# Patient Record
Sex: Male | Born: 1960 | Race: White | Hispanic: No | Marital: Married | Smoking: Former smoker
Health system: Southern US, Community
[De-identification: ages and names within clinical notes are randomized; demographics above are authoritative.]

## PROBLEM LIST (undated history)

## (undated) DIAGNOSIS — N529 Male erectile dysfunction, unspecified: Secondary | ICD-10-CM

## (undated) DIAGNOSIS — I440 Atrioventricular block, first degree: Secondary | ICD-10-CM

## (undated) DIAGNOSIS — E785 Hyperlipidemia, unspecified: Secondary | ICD-10-CM

## (undated) DIAGNOSIS — E8801 Alpha-1-antitrypsin deficiency: Secondary | ICD-10-CM

## (undated) DIAGNOSIS — R911 Solitary pulmonary nodule: Secondary | ICD-10-CM

## (undated) DIAGNOSIS — I10 Essential (primary) hypertension: Secondary | ICD-10-CM

## (undated) DIAGNOSIS — Z8349 Family history of other endocrine, nutritional and metabolic diseases: Secondary | ICD-10-CM

## (undated) HISTORY — DX: Male erectile dysfunction, unspecified: N52.9

## (undated) HISTORY — DX: Essential (primary) hypertension: I10

## (undated) HISTORY — DX: Hyperlipidemia, unspecified: E78.5

## (undated) HISTORY — DX: Alpha-1-antitrypsin deficiency: E88.01

## (undated) HISTORY — DX: Solitary pulmonary nodule: R91.1

## (undated) HISTORY — PX: OTHER SURGICAL HISTORY: SHX169

## (undated) HISTORY — PX: APPENDECTOMY: SHX54

## (undated) HISTORY — DX: Atrioventricular block, first degree: I44.0

## (undated) HISTORY — DX: Family history of other endocrine, nutritional and metabolic diseases: Z83.49

---

## 2012-11-21 ENCOUNTER — Encounter: Payer: Self-pay | Admitting: Emergency Medicine

## 2012-11-21 ENCOUNTER — Emergency Department
Admission: EM | Admit: 2012-11-21 | Discharge: 2012-11-21 | Disposition: A | Discharge status: Home / Self Care | Payer: PRIVATE HEALTH INSURANCE | Source: Home / Self Care | Attending: Family Medicine | Admitting: Family Medicine

## 2012-11-21 DIAGNOSIS — J329 Chronic sinusitis, unspecified: Secondary | ICD-10-CM

## 2012-11-21 MED ORDER — METHYLPREDNISOLONE ACETATE 80 MG/ML IJ SUSP
80.0000 mg | Freq: Once | INTRAMUSCULAR | Status: AC
  Administered 2012-11-21: 80 mg via INTRAMUSCULAR

## 2012-11-21 MED ORDER — AZITHROMYCIN 250 MG PO TABS: ORAL_TABLET | ORAL | Status: DC

## 2012-11-21 MED ORDER — DOXYCYCLINE HYCLATE 100 MG PO CAPS: 100.0000 mg | ORAL_CAPSULE | Freq: Two times a day (BID) | ORAL | Status: AC

## 2012-11-21 NOTE — ED Notes (Signed)
Wrong entry with first set of vitals

## 2012-11-21 NOTE — ED Notes (Signed)
Upper respiratory symptoms x 4weeks

## 2012-11-21 NOTE — ED Provider Notes (Signed)
CSN: [id number]     Arrival date & time 11/21/12  1619 History   First MD Initiated Contact with Patient 11/21/12 1648     Chief Complaint  Patient presents with  . Cough    HPI SINUSITIS Onset:  2-3 weeks  Location: bilateral maxillary sinuses Description:bilateral maxillary sinus pressure   Modifying factors: baseline hx/o COPD. Wife also currently hospitalized for PNA per pt.   Symptoms Cough:  Yes, minimal  Discharge:  yes Fever: yes Sinus Pressure:  yes Ears Blocked:  no Teeth Ache:  no Frontal Headache:  no Second Sickening:  Persistent sickness  Red Flags Change in mental state: no Change in vision: no     Past Medical History  Diagnosis Date  . COPD (chronic obstructive pulmonary disease)    No past surgical history on file. No family history on file. History  Substance Use Topics  . Smoking status: Former Games developer  . Smokeless tobacco: Not on file  . Alcohol Use: No    Review of Systems  All other systems reviewed and are negative.    Allergies  Darvocet  Home Medications  No current outpatient prescriptions on file. BP 130/85  Pulse 56  Temp(Src) 98.2 F (36.8 C) (Tympanic)  Ht 5\' 2"  (1.575 m)  Wt 148 lb (67.132 kg)  BMI 27.06 kg/m2  SpO2 100% Physical Exam  Constitutional: He appears well-developed and well-nourished.  HENT:  Head: Normocephalic and atraumatic.  Right Ear: External ear normal.  Left Ear: External ear normal.  Bilateral maxillary sinus TTP +nasal erythema, rhinorrhea bilaterally, + post oropharyngeal erythema    Eyes: Conjunctivae are normal. Pupils are equal, round, and reactive to light.  Neck: Normal range of motion.  Cardiovascular: Normal rate and regular rhythm.   Pulmonary/Chest: Effort normal and breath sounds normal.  Abdominal: Soft.  Musculoskeletal: Normal range of motion.  Neurological: He is alert.  Skin: Skin is warm.    ED Course  Procedures (including critical care time) Labs Review Labs  Reviewed - No data to display Imaging Review No results found.    MDM   1. Sinusitis    Will treat with doxy for acute sinusitis coverage as well as lower resp coverage.   Depomedrol 80mg  IM x 1 to help with sinus pressure.  Discussed infectious and ENT/resp red flags at length with pt.  Follow up as needed.     The patient and/or caregiver has been counseled thoroughly with regard to treatment plan and/or medications prescribed including dosage, schedule, interactions, rationale for use, and possible side effects and they verbalize understanding. Diagnoses and expected course of recovery discussed and will return if not improved as expected or if the condition worsens. Patient and/or caregiver verbalized understanding.         Doree Albee, MD 11/21/12 [phone]

## 2012-11-21 NOTE — ED Notes (Deleted)
Cough x7days, states she is coughing so much it causes her to vomit and /or urinate on herself 

## 2013-11-15 ENCOUNTER — Ambulatory Visit (INDEPENDENT_AMBULATORY_CARE_PROVIDER_SITE_OTHER): Payer: PRIVATE HEALTH INSURANCE | Admitting: Family Medicine

## 2013-11-15 ENCOUNTER — Encounter: Payer: Self-pay | Admitting: Family Medicine

## 2013-11-15 VITALS — BP 122/78 | HR 76 | Ht 69.0 in | Wt 228.0 lb

## 2013-11-15 DIAGNOSIS — Z1211 Encounter for screening for malignant neoplasm of colon: Secondary | ICD-10-CM

## 2013-11-15 DIAGNOSIS — Z0000 Encounter for general adult medical examination without abnormal findings: Secondary | ICD-10-CM

## 2013-11-15 NOTE — Progress Notes (Signed)
CC: Trevor Guerrero is a 53 y.o. 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 No acute complaints other than a mild headache in the frontal region has been present since he awoke this morning  Rare alcohol use no tobacco or recreational drug use.  Review of Systems - General ROS: negative for - chills, fever, night sweats, weight gain or weight loss Ophthalmic ROS: negative for - decreased vision Psychological ROS: negative for - anxiety or depression ENT ROS: negative for - hearing change, nasal congestion, tinnitus or allergies Hematological and Lymphatic ROS: negative for - bleeding problems, bruising or swollen lymph nodes Breast ROS: negative Respiratory ROS: no cough, shortness of breath, or wheezing Cardiovascular ROS: no chest pain or dyspnea on exertion Gastrointestinal ROS: no abdominal pain, change in bowel habits, or black or bloody stools Genito-Urinary ROS: negative for - genital discharge, genital ulcers, incontinence or abnormal bleeding from genitals Musculoskeletal ROS: negative for - joint pain or muscle pain Neurological ROS: negative for -memory loss Dermatological ROS: negative for lumps, mole changes, rash and skin lesion changes  History reviewed. No pertinent past medical history.  Past Surgical History  Procedure Laterality Date  . Appendectomy    . Left testicular mass Left    Family History  Problem Relation Age of Onset  . Asthma Neg Hx   . Heart failure Neg Hx   .  Hypertension Neg Hx     History   Social History  . Marital Status: Married    Spouse Name: N/A    Number of Children: N/A  . Years of Education: N/A   Occupational History  . Not on file.   Social History Main Topics  . Smoking status: Former Smoker    Quit date: 11/16/1979  . Smokeless tobacco: Not on file  . Alcohol Use: No  . Drug Use: No  . Sexual Activity:    Partners: Female   Other Topics Concern  . Not on file   Social History Narrative     Objective: BP 122/78 mmHg  Pulse 76  Ht 5\' 9"  (1.753 m)  Wt 228 lb (103.42 kg)  BMI 33.65 kg/m2  General: No Acute Distress HEENT: Atraumatic, normocephalic, conjunctivae normal without scleral icterus.  No nasal discharge, hearing grossly intact, TMs with good landmarks bilaterally with no middle ear abnormalities, posterior pharynx clear without oral lesions. Neck: Supple, trachea midline, no cervical nor supraclavicular adenopathy. Pulmonary: Clear to auscultation bilaterally without wheezing, rhonchi, nor rales. Cardiac: Regular rate and rhythm.  No murmurs, rubs, nor gallops. No peripheral edema.  2+ peripheral pulses bilaterally. Abdomen: Bowel sounds normal.  No masses.  Non-tender without rebound.  Negative Murphy's sign. MSK: Grossly intact, no signs of weakness.  Full strength throughout upper and lower extremities.  Full ROM in upper and lower extremities.  No midline spinal tenderness. Neuro: Gait unremarkable, CN II-XII grossly intact.  C5-C6 Reflex 2/4 Bilaterally, L4 Reflex 2/4 Bilaterally.  Cerebellar function intact. Skin: No rashes. Psych: Alert and oriented to person/place/time.  Thought process normal. No anxiety/depression.  Assessment & Plan: Trevor Guerrero was seen  today for establish care.  Diagnoses and associated orders for this visit:  Annual physical exam - Lipid panel - PSA - COMPLETE METABOLIC PANEL WITH GFR  Colon cancer screening - Ambulatory referral to Gastroenterology    Healthy  lifestyle interventions including but not limited to regular exercise, a healthy low fat diet, moderation of salt intake, the dangers of tobacco/alcohol/recreational drug use, nutrition supplementation, and accident avoidance were discussed with the patient and a handout was provided for future reference.  Referral placed for colonoscopy  Return if symptoms worsen or fail to improve.

## 2013-11-15 NOTE — Patient Instructions (Signed)
Dr. Kitt Ledet's General Advice Following Your Complete Physical Exam  The Benefits of Regular Exercise: Unless you suffer from an uncontrolled cardiovascular condition, studies strongly suggest that regular exercise and physical activity will add to both the quality and length of your life.  The World Health Organization recommends 150 minutes of moderate intensity aerobic activity every week.  This is best split over 3-4 days a week, and can be as simple as a brisk walk for just over 35 minutes "most days of the week".  This type of exercise has been shown to lower LDL-Cholesterol, lower average blood sugars, lower blood pressure, lower cardiovascular disease risk, improve memory, and increase one's overall sense of wellbeing.  The addition of anaerobic (or "strength training") exercises offers additional benefits including but not limited to increased metabolism, prevention of osteoporosis, and improved overall cholesterol levels.  How Can I Strive For A Low-Fat Diet?: Current guidelines recommend that 25-35 percent of your daily energy (food) intake should come from fats.  One might ask how can this be achieved without having to dissect each meal on a daily basis?  Switch to skim or 1% milk instead of whole milk.  Focus on lean meats such as ground turkey, fresh fish, baked chicken, and lean cuts of beef as your source of dietary protein.  Limit saturated fat consumption to less than 10% of your daily caloric intake.  Limit trans fatty acid consumption primarily by limiting synthetic trans fats such as partially hydrogenated oils (Ex: fried fast foods).  Substitute olive or vegetable oil for solid fats where possible.  Moderation of Salt Intake: Provided you don't carry a diagnosis of congestive heart failure nor renal failure, I recommend a daily allowance of no more than 2300 mg of salt (sodium).  Keeping under this daily goal is associated with a decreased risk of cardiovascular events, creeping  above it can lead to elevated blood pressures and increases your risk of cardiovascular events.  Milligrams (mg) of salt is listed on all nutrition labels, and your daily intake can add up faster than you think.  Most canned and frozen dinners can pack in over half your daily salt allowance in one meal.    Lifestyle Health Risks: Certain lifestyle choices carry specific health risks.  As you may already know, tobacco use has been associated with increasing one's risk of cardiovascular disease, pulmonary disease, numerous cancers, among many other issues.  What you may not know is that there are medications and nicotine replacement strategies that can more than double your chances of successfully quitting.  I would be thrilled to help manage your quitting strategy if you currently use tobacco products.  When it comes to alcohol use, I've yet to find an "ideal" daily allowance.  Provided an individual does not have a medical condition that is exacerbated by alcohol consumption, general guidelines determine "safe drinking" as no more than two standard drinks for a man or no more than one standard drink for a male per day.  However, much debate still exists on whether any amount of alcohol consumption is technically "safe".  My general advice, keep alcohol consumption to a minimum for general health promotion.  If you or others believe that alcohol, tobacco, or recreational drug use is interfering with your life, I would be happy to provide confidential counseling regarding treatment options.  General "Over The Counter" Nutrition Advice: Postmenopausal women should aim for a daily calcium intake of 1200 mg, however a significant portion of this might already be   provided by diets including milk, yogurt, cheese, and other dairy products.  Vitamin D has been shown to help preserve bone density, prevent fatigue, and has even been shown to help reduce falls in the elderly.  Ensuring a daily intake of 800 Units of  Vitamin D is a good place to start to enjoy the above benefits, we can easily check your Vitamin D level to see if you'd potentially benefit from supplementation beyond 800 Units a day.  Folic Acid intake should be of particular concern to women of childbearing age.  Daily consumption of 400-800 mcg of Folic Acid is recommended to minimize the chance of spinal cord defects in a fetus should pregnancy occur.    For many adults, accidents still remain one of the most common culprits when it comes to cause of death.  Some of the simplest but most effective preventitive habits you can adopt include regular seatbelt use, proper helmet use, securing firearms, and regularly testing your smoke and carbon monoxide detectors.  Trevor Christon B. Larae Caison DO [facility] [address], [address] [place], [place] [address] Phone: [phone]  

## 2013-11-16 ENCOUNTER — Encounter: Payer: Self-pay | Admitting: Family Medicine

## 2013-11-16 DIAGNOSIS — E785 Hyperlipidemia, unspecified: Secondary | ICD-10-CM | POA: Insufficient documentation

## 2013-11-16 HISTORY — DX: Hyperlipidemia, unspecified: E78.5

## 2013-11-16 LAB — COMPLETE METABOLIC PANEL WITH GFR
ALK PHOS: 79 U/L (ref 39–117)
ALT: 29 U/L (ref 0–53)
AST: 20 U/L (ref 0–37)
Albumin: 4.8 g/dL (ref 3.5–5.2)
BILIRUBIN TOTAL: 1 mg/dL (ref 0.2–1.2)
BUN: 13 mg/dL (ref 6–23)
CO2: 26 mEq/L (ref 19–32)
CREATININE: 1.08 mg/dL (ref 0.50–1.35)
Calcium: 10 mg/dL (ref 8.4–10.5)
Chloride: 102 mEq/L (ref 96–112)
GFR, EST NON AFRICAN AMERICAN: 77 mL/min
GFR, Est African American: 89 mL/min
Glucose, Bld: 87 mg/dL (ref 70–99)
Potassium: 4.3 mEq/L (ref 3.5–5.3)
SODIUM: 137 meq/L (ref 135–145)
Total Protein: 7.4 g/dL (ref 6.0–8.3)

## 2013-11-16 LAB — LIPID PANEL
CHOL/HDL RATIO: 4.3 ratio
CHOLESTEROL: 185 mg/dL (ref 0–200)
HDL: 43 mg/dL (ref >39)
LDL Cholesterol: 117 mg/dL — ABNORMAL HIGH (ref 0–99)
Triglycerides: 125 mg/dL (ref <150)
VLDL: 25 mg/dL (ref 0–40)

## 2013-11-16 LAB — PSA: PSA: 0.44 ng/mL (ref <=4.00)

## 2013-11-24 ENCOUNTER — Encounter: Payer: Self-pay | Admitting: Family Medicine

## 2013-11-24 DIAGNOSIS — Z87438 Personal history of other diseases of male genital organs: Secondary | ICD-10-CM | POA: Insufficient documentation

## 2013-11-24 DIAGNOSIS — R911 Solitary pulmonary nodule: Secondary | ICD-10-CM

## 2013-11-24 HISTORY — DX: Solitary pulmonary nodule: R91.1

## 2014-01-02 LAB — HM COLONOSCOPY

## 2014-03-27 ENCOUNTER — Ambulatory Visit (INDEPENDENT_AMBULATORY_CARE_PROVIDER_SITE_OTHER): Payer: PRIVATE HEALTH INSURANCE | Admitting: Family Medicine

## 2014-03-27 ENCOUNTER — Encounter: Payer: Self-pay | Admitting: Family Medicine

## 2014-03-27 VITALS — BP 139/77 | HR 48 | Ht 69.0 in | Wt 224.0 lb

## 2014-03-27 DIAGNOSIS — N529 Male erectile dysfunction, unspecified: Secondary | ICD-10-CM

## 2014-03-27 NOTE — Progress Notes (Signed)
CC: Trevor Guerrero is a 54 y.o. male is here for No chief complaint on file.   Subjective: HPI:  Since January he has been experiencing difficulty maintaining erections. This happens during every sexual encounter with his wife. He is monogamous and has no other genitourinary complaints or symptoms. He still physically attractive to her. He's never had this before no interventions as of yet. He exercises every other day on his treadmill and never has any exertional chest pain. Denies dysuria, penile discharge, testicular pain, nor any genital lesions. He denies any mental disturbance such as anxiety or depression. No new changes to medications. No unintentional weight loss or gain, fevers chills.   Review Of Systems Outlined In HPI  No past medical history on file.  Past Surgical History  Procedure Laterality Date  . Appendectomy    . Left testicular mass Left    Family History  Problem Relation Age of Onset  . Asthma Neg Hx   . Heart failure Neg Hx   . Hypertension Neg Hx     History   Social History  . Marital Status: Married    Spouse Name: N/A  . Number of Children: N/A  . Years of Education: N/A   Occupational History  . Not on file.   Social History Main Topics  . Smoking status: Former Smoker    Quit date: 11/16/1979  . Smokeless tobacco: Not on file  . Alcohol Use: No  . Drug Use: No  . Sexual Activity:    Partners: Female   Other Topics Concern  . Not on file   Social History Narrative     Objective: BP 139/77 mmHg  Pulse 48  Ht 5\' 9"  (1.753 m)  Wt 224 lb (101.606 kg)  BMI 33.06 kg/m2  Vital signs reviewed. General: Alert and Oriented, No Acute Distress HEENT: Pupils equal, round, reactive to light. Conjunctivae clear.  External ears unremarkable.  Moist mucous membranes. Lungs: Clear and comfortable work of breathing, speaking in full sentences without accessory muscle use. Cardiac: Regular rate and rhythm.  Neuro: CN II-XII grossly intact, gait  normal. Extremities: No peripheral edema.  Strong peripheral pulses.  Mental Status: No depression, anxiety, nor agitation. Logical though process. Skin: Warm and dry.  Assessment & Plan: Diagnoses and all orders for this visit:  Erectile dysfunction, unspecified erectile dysfunction type Orders: -     Testosterone   We'll rule out hypogonadism and if abnormal will offer testosterone supplementation if hemoglobin is normal, PSA was normal back in November. If testosterone is normal next step would be generic Viagra at [facility].  Return if symptoms worsen or fail to improve.

## 2014-03-28 ENCOUNTER — Telehealth: Payer: Self-pay | Admitting: Family Medicine

## 2014-03-28 DIAGNOSIS — N529 Male erectile dysfunction, unspecified: Secondary | ICD-10-CM

## 2014-03-28 HISTORY — DX: Male erectile dysfunction, unspecified: N52.9

## 2014-03-28 LAB — TESTOSTERONE: TESTOSTERONE: 341 ng/dL (ref 300–890)

## 2014-03-28 MED ORDER — SILDENAFIL CITRATE 20 MG PO TABS: ORAL_TABLET | ORAL | Status: DC

## 2014-03-28 NOTE — Telephone Encounter (Signed)
Sue Lush, Will you please let patient know that his testosterone level was normal.  I've printed an Rx for generic viagra and if he takes it to [facility] in [place] it will be cheaper than going through his insurance at any other pharmacy.

## 2014-03-28 NOTE — Telephone Encounter (Signed)
Pt notified and faxed rx to [facility]

## 2014-10-24 ENCOUNTER — Encounter: Payer: Self-pay | Admitting: Family Medicine

## 2014-10-24 ENCOUNTER — Ambulatory Visit (INDEPENDENT_AMBULATORY_CARE_PROVIDER_SITE_OTHER): Payer: PRIVATE HEALTH INSURANCE | Admitting: Family Medicine

## 2014-10-24 VITALS — BP 129/65 | HR 50 | Ht 69.0 in | Wt 228.0 lb

## 2014-10-24 DIAGNOSIS — R030 Elevated blood-pressure reading, without diagnosis of hypertension: Secondary | ICD-10-CM | POA: Diagnosis not present

## 2014-10-24 DIAGNOSIS — IMO0001 Reserved for inherently not codable concepts without codable children: Secondary | ICD-10-CM

## 2014-10-24 NOTE — Progress Notes (Signed)
CC: Trevor Guerrero is a 54 y.o. male is here for No chief complaint on file.   Subjective: HPI:  Express his concern about systolic blood pressure of 160 when randomly checked at a local pharmacy on Saturday. He's been feeling overall pretty normal other than some fatigue when climbing a flight of stairs last week. On review of his recent diet he's been indulging in processed foods, smoked meats, and other salty foods. He's never had high blood pressure in the past. He denies chest pain shortness of breath orthopnea nor peripheral edema.   Review Of Systems Outlined In HPI  No past medical history on file.  Past Surgical History  Procedure Laterality Date  . Appendectomy    . Left testicular mass Left    Family History  Problem Relation Age of Onset  . Asthma Neg Hx   . Heart failure Neg Hx   . Hypertension Neg Hx     Social History   Social History  . Marital Status: Married    Spouse Name: N/A  . Number of Children: N/A  . Years of Education: N/A   Occupational History  . Not on file.   Social History Main Topics  . Smoking status: Former Smoker    Quit date: 11/16/1979  . Smokeless tobacco: Not on file  . Alcohol Use: No  . Drug Use: No  . Sexual Activity:    Partners: Female   Other Topics Concern  . Not on file   Social History Narrative     Objective: BP 129/65 mmHg  Pulse 50  Ht 5\' 9"  (1.753 m)  Wt 228 lb (103.42 kg)  BMI 33.65 kg/m2 Vital signs reviewed. General: Alert and Oriented, No Acute Distress HEENT: Pupils equal, round, reactive to light. Conjunctivae clear.  External ears unremarkable.  Moist mucous membranes. Lungs: Clear and comfortable work of breathing, speaking in full sentences without accessory muscle use. Cardiac: Regular rate and rhythm. No murmurs rubs or gallops Neuro: CN II-XII grossly intact, gait normal. Extremities: No peripheral edema.  Strong peripheral pulses.  Mental Status: No depression, anxiety, nor agitation. Logical  though process. Skin: Warm and dry.  Assessment & Plan: Diagnoses and all orders for this visit:  Elevated blood pressure   His blood pressure today is quite reassuring,I discussed that his diet is most certainly heavily influencing his elevated blood pressure reading outside of our office. Discussed the DASH diet and further ways to reduce sodium in the diet. He'll have his blood pressure checked at [facility] in 1-2 weeks.   Return if symptoms worsen or fail to improve, for 1-2 months for physical.

## 2014-10-24 NOTE — Patient Instructions (Signed)
DASH Eating Plan  DASH stands for "Dietary Approaches to Stop Hypertension." The DASH eating plan is a healthy eating plan that has been shown to reduce high blood pressure (hypertension). Additional health benefits may include reducing the risk of type 2 diabetes mellitus, heart disease, and stroke. The DASH eating plan may also help with weight loss.  WHAT DO I NEED TO KNOW ABOUT THE DASH EATING PLAN?  For the DASH eating plan, you will follow these general guidelines:  · Choose foods with a percent daily value for sodium of less than 5% (as listed on the food label).  · Use salt-free seasonings or herbs instead of table salt or sea salt.  · Check with your health care provider or pharmacist before using salt substitutes.  · Eat lower-sodium products, often labeled as "lower sodium" or "no salt added."  · Eat fresh foods.  · Eat more vegetables, fruits, and low-fat dairy products.  · Choose whole grains. Look for the word "whole" as the first word in the ingredient list.  · Choose fish and skinless chicken or turkey more often than red meat. Limit fish, poultry, and meat to 6 oz (170 g) each day.  · Limit sweets, desserts, sugars, and sugary drinks.  · Choose heart-healthy fats.  · Limit cheese to 1 oz (28 g) per day.  · Eat more home-cooked food and less restaurant, buffet, and fast food.  · Limit fried foods.  · Cook foods using methods other than frying.  · Limit canned vegetables. If you do use them, rinse them well to decrease the sodium.  · When eating at a restaurant, ask that your food be prepared with less salt, or no salt if possible.  WHAT FOODS CAN I EAT?  Seek help from a dietitian for individual calorie needs.  Grains  Whole grain or whole wheat bread. Brown rice. Whole grain or whole wheat pasta. Quinoa, bulgur, and whole grain cereals. Low-sodium cereals. Corn or whole wheat flour tortillas. Whole grain cornbread. Whole grain crackers. Low-sodium crackers.  Vegetables  Fresh or frozen vegetables  (raw, steamed, roasted, or grilled). Low-sodium or reduced-sodium tomato and vegetable juices. Low-sodium or reduced-sodium tomato sauce and paste. Low-sodium or reduced-sodium canned vegetables.   Fruits  All fresh, canned (in natural juice), or frozen fruits.  Meat and Other Protein Products  Ground beef (85% or leaner), grass-fed beef, or beef trimmed of fat. Skinless chicken or turkey. Ground chicken or turkey. Pork trimmed of fat. All fish and seafood. Eggs. Dried beans, peas, or lentils. Unsalted nuts and seeds. Unsalted canned beans.  Dairy  Low-fat dairy products, such as skim or 1% milk, 2% or reduced-fat cheeses, low-fat ricotta or cottage cheese, or plain low-fat yogurt. Low-sodium or reduced-sodium cheeses.  Fats and Oils  Tub margarines without trans fats. Light or reduced-fat mayonnaise and salad dressings (reduced sodium). Avocado. Safflower, olive, or canola oils. Natural peanut or almond butter.  Other  Unsalted popcorn and pretzels.  The items listed above may not be a complete list of recommended foods or beverages. Contact your dietitian for more options.  WHAT FOODS ARE NOT RECOMMENDED?  Grains  White bread. White pasta. White rice. Refined cornbread. Bagels and croissants. Crackers that contain trans fat.  Vegetables  Creamed or fried vegetables. Vegetables in a cheese sauce. Regular canned vegetables. Regular canned tomato sauce and paste. Regular tomato and vegetable juices.  Fruits  Dried fruits. Canned fruit in light or heavy syrup. Fruit juice.  Meat and Other Protein   Products  Fatty cuts of meat. Ribs, chicken wings, bacon, sausage, bologna, salami, chitterlings, fatback, hot dogs, bratwurst, and packaged luncheon meats. Salted nuts and seeds. Canned beans with salt.  Dairy  Whole or 2% milk, cream, half-and-half, and cream cheese. Whole-fat or sweetened yogurt. Full-fat cheeses or blue cheese. Nondairy creamers and whipped toppings. Processed cheese, cheese spreads, or cheese  curds.  Condiments  Onion and garlic salt, seasoned salt, table salt, and sea salt. Canned and packaged gravies. Worcestershire sauce. Tartar sauce. Barbecue sauce. Teriyaki sauce. Soy sauce, including reduced sodium. Steak sauce. Fish sauce. Oyster sauce. Cocktail sauce. Horseradish. Ketchup and mustard. Meat flavorings and tenderizers. Bouillon cubes. Hot sauce. Tabasco sauce. Marinades. Taco seasonings. Relishes.  Fats and Oils  Butter, stick margarine, lard, shortening, ghee, and bacon fat. Coconut, palm kernel, or palm oils. Regular salad dressings.  Other  Pickles and olives. Salted popcorn and pretzels.  The items listed above may not be a complete list of foods and beverages to avoid. Contact your dietitian for more information.  WHERE CAN I FIND MORE INFORMATION?  National Heart, Lung, and Blood Institute: www.nhlbi.nih.gov/health/health-topics/topics/dash/     This information is not intended to replace advice given to you by your health care provider. Make sure you discuss any questions you have with your health care provider.     Document Released: 12/12/2010 Document Revised: 01/13/2014 Document Reviewed: 10/27/2012  Elsevier Interactive Patient Education ©2016 [employer]

## 2014-11-28 ENCOUNTER — Encounter: Payer: Self-pay | Admitting: Family Medicine

## 2014-11-28 ENCOUNTER — Ambulatory Visit (INDEPENDENT_AMBULATORY_CARE_PROVIDER_SITE_OTHER): Payer: PRIVATE HEALTH INSURANCE | Admitting: Family Medicine

## 2014-11-28 VITALS — BP 150/83 | HR 50 | Wt 223.0 lb

## 2014-11-28 DIAGNOSIS — Z0000 Encounter for general adult medical examination without abnormal findings: Secondary | ICD-10-CM

## 2014-11-28 LAB — COMPLETE METABOLIC PANEL WITH GFR
ALBUMIN: 4.3 g/dL (ref 3.6–5.1)
ALK PHOS: 52 U/L (ref 40–115)
ALT: 22 U/L (ref 9–46)
AST: 18 U/L (ref 10–35)
BUN: 19 mg/dL (ref 7–25)
CALCIUM: 9.6 mg/dL (ref 8.6–10.3)
CHLORIDE: 106 mmol/L (ref 98–110)
CO2: 25 mmol/L (ref 20–31)
Creat: 0.96 mg/dL (ref 0.70–1.33)
GFR, EST NON AFRICAN AMERICAN: 89 mL/min (ref >=60)
GLUCOSE: 92 mg/dL (ref 65–99)
POTASSIUM: 4.3 mmol/L (ref 3.5–5.3)
SODIUM: 139 mmol/L (ref 135–146)
TOTAL PROTEIN: 6.9 g/dL (ref 6.1–8.1)
Total Bilirubin: 0.6 mg/dL (ref 0.2–1.2)

## 2014-11-28 LAB — CBC
HCT: 40.5 % (ref 39.0–52.0)
Hemoglobin: 14.1 g/dL (ref 13.0–17.0)
MCH: 29.7 pg (ref 26.0–34.0)
MCHC: 34.8 g/dL (ref 30.0–36.0)
MCV: 85.4 fL (ref 78.0–100.0)
MPV: 9.1 fL (ref 8.6–12.4)
Platelets: 242 K/uL (ref 150–400)
RBC: 4.74 MIL/uL (ref 4.22–5.81)
RDW: 13.8 % (ref 11.5–15.5)
WBC: 5.7 K/uL (ref 4.0–10.5)

## 2014-11-28 LAB — LIPID PANEL
CHOL/HDL RATIO: 5.1 ratio — AB (ref <=5.0)
Cholesterol: 198 mg/dL (ref 125–200)
HDL: 39 mg/dL — AB (ref >=40)
LDL Cholesterol: 135 mg/dL — ABNORMAL HIGH (ref <130)
TRIGLYCERIDES: 121 mg/dL (ref <150)
VLDL: 24 mg/dL (ref <30)

## 2014-11-28 NOTE — Patient Instructions (Signed)
Dr. Malvin Morrish's General Advice Following Your Complete Physical Exam  The Benefits of Regular Exercise: Unless you suffer from an uncontrolled cardiovascular condition, studies strongly suggest that regular exercise and physical activity will add to both the quality and length of your life.  The World Health Organization recommends 150 minutes of moderate intensity aerobic activity every week.  This is best split over 3-4 days a week, and can be as simple as a brisk walk for just over 35 minutes "most days of the week".  This type of exercise has been shown to lower LDL-Cholesterol, lower average blood sugars, lower blood pressure, lower cardiovascular disease risk, improve memory, and increase one's overall sense of wellbeing.  The addition of anaerobic (or "strength training") exercises offers additional benefits including but not limited to increased metabolism, prevention of osteoporosis, and improved overall cholesterol levels.  How Can I Strive For A Low-Fat Diet?: Current guidelines recommend that 25-35 percent of your daily energy (food) intake should come from fats.  One might ask how can this be achieved without having to dissect each meal on a daily basis?  Switch to skim or 1% milk instead of whole milk.  Focus on lean meats such as ground turkey, fresh fish, baked chicken, and lean cuts of beef as your source of dietary protein.  Limit saturated fat consumption to less than 10% of your daily caloric intake.  Limit trans fatty acid consumption primarily by limiting synthetic trans fats such as partially hydrogenated oils (Ex: fried fast foods).  Substitute olive or vegetable oil for solid fats where possible.  Moderation of Salt Intake: Provided you don't carry a diagnosis of congestive heart failure nor renal failure, I recommend a daily allowance of no more than 2300 mg of salt (sodium).  Keeping under this daily goal is associated with a decreased risk of cardiovascular events, creeping  above it can lead to elevated blood pressures and increases your risk of cardiovascular events.  Milligrams (mg) of salt is listed on all nutrition labels, and your daily intake can add up faster than you think.  Most canned and frozen dinners can pack in over half your daily salt allowance in one meal.    Lifestyle Health Risks: Certain lifestyle choices carry specific health risks.  As you may already know, tobacco use has been associated with increasing one's risk of cardiovascular disease, pulmonary disease, numerous cancers, among many other issues.  What you may not know is that there are medications and nicotine replacement strategies that can more than double your chances of successfully quitting.  I would be thrilled to help manage your quitting strategy if you currently use tobacco products.  When it comes to alcohol use, I've yet to find an "ideal" daily allowance.  Provided an individual does not have a medical condition that is exacerbated by alcohol consumption, general guidelines determine "safe drinking" as no more than two standard drinks for a man or no more than one standard drink for a male per day.  However, much debate still exists on whether any amount of alcohol consumption is technically "safe".  My general advice, keep alcohol consumption to a minimum for general health promotion.  If you or others believe that alcohol, tobacco, or recreational drug use is interfering with your life, I would be happy to provide confidential counseling regarding treatment options.  General "Over The Counter" Nutrition Advice: Postmenopausal women should aim for a daily calcium intake of 1200 mg, however a significant portion of this might already be   provided by diets including milk, yogurt, cheese, and other dairy products.  Vitamin D has been shown to help preserve bone density, prevent fatigue, and has even been shown to help reduce falls in the elderly.  Ensuring a daily intake of 800 Units of  Vitamin D is a good place to start to enjoy the above benefits, we can easily check your Vitamin D level to see if you'd potentially benefit from supplementation beyond 800 Units a day.  Folic Acid intake should be of particular concern to women of childbearing age.  Daily consumption of 400-800 mcg of Folic Acid is recommended to minimize the chance of spinal cord defects in a fetus should pregnancy occur.    For many adults, accidents still remain one of the most common culprits when it comes to cause of death.  Some of the simplest but most effective preventitive habits you can adopt include regular seatbelt use, proper helmet use, securing firearms, and regularly testing your smoke and carbon monoxide detectors.  Seriah Brotzman B. Anatasia Tino DO [facility] [address], [address] [place], [place] [address] Phone: [phone]  

## 2014-11-28 NOTE — Progress Notes (Signed)
CC: Trevor Guerrero is a 54 y.o. male is here for Annual Exam and Hypertension   Subjective: HPI:  Colonoscopy: Referred 2015, Clear until 2025 Prostate: Discussed screening risks/beneifts with patient today, obtaining PSA   Influenza Vaccine: UTD Pneumovax: No current indication Td/Tdap: UTD from 2009 Zoster: (Start 55 yo)  Requesting complete physical exam with no acute complaints  Review of Systems - General ROS: negative for - chills, fever, night sweats, weight gain or weight loss Ophthalmic ROS: negative for - decreased vision Psychological ROS: negative for - anxiety or depression ENT ROS: negative for - hearing change, nasal congestion, tinnitus or allergies Hematological and Lymphatic ROS: negative for - bleeding problems, bruising or swollen lymph nodes Breast ROS: negative Respiratory ROS: no cough, shortness of breath, or wheezing Cardiovascular ROS: no chest pain or dyspnea on exertion Gastrointestinal ROS: no abdominal pain, change in bowel habits, or black or bloody stools Genito-Urinary ROS: negative for - genital discharge, genital ulcers, incontinence or abnormal bleeding from genitals Musculoskeletal ROS: negative for - joint pain or muscle pain Neurological ROS: negative for - headaches or memory loss Dermatological ROS: negative for lumps, mole changes, rash and skin lesion changes No past medical history on file.  Past Surgical History  Procedure Laterality Date  . Appendectomy    . Left testicular mass Left    Family History  Problem Relation Age of Onset  . Asthma Neg Hx   . Heart failure Neg Hx   . Hypertension Neg Hx     Social History   Social History  . Marital Status: Married    Spouse Name: N/A  . Number of Children: N/A  . Years of Education: N/A   Occupational History  . Not on file.   Social History Main Topics  . Smoking status: Former Smoker    Quit date: 11/16/1979  . Smokeless tobacco: Not on file  . Alcohol Use: No  . Drug  Use: No  . Sexual Activity:    Partners: Female   Other Topics Concern  . Not on file   Social History Narrative     Objective: BP 150/83 mmHg  Pulse 50  Wt 223 lb (101.152 kg)  General: No Acute Distress HEENT: Atraumatic, normocephalic, conjunctivae normal without scleral icterus.  No nasal discharge, hearing grossly intact, TMs with good landmarks bilaterally with no middle ear abnormalities, posterior pharynx clear without oral lesions. Neck: Supple, trachea midline, no cervical nor supraclavicular adenopathy. Pulmonary: Clear to auscultation bilaterally without wheezing, rhonchi, nor rales. Cardiac: Regular rate and rhythm.  No murmurs, rubs, nor gallops. No peripheral edema.  2+ peripheral pulses bilaterally. Abdomen: Bowel sounds normal.  No masses.  Non-tender without rebound.  Negative Murphy's sign. MSK: Grossly intact, no signs of weakness.  Full strength throughout upper and lower extremities.  Full ROM in upper and lower extremities.  No midline spinal tenderness. Neuro: Gait unremarkable, CN II-XII grossly intact.  C5-C6 Reflex 2/4 Bilaterally, L4 Reflex 2/4 Bilaterally.  Cerebellar function intact. Skin: No rashes. Psych: Alert and oriented to person/place/time.  Thought process normal. No anxiety/depression.  Assessment & Plan: Trevor Guerrero was seen today for annual exam and hypertension.  Diagnoses and all orders for this visit:  Annual physical exam -     Lipid panel -     COMPLETE METABOLIC PANEL WITH GFR -     PSA -     CBC  Healthy lifestyle interventions including but not limited to regular exercise, a healthy low fat diet, moderation of salt  intake, the dangers of tobacco/alcohol/recreational drug use, nutrition supplementation, and accident avoidance were discussed with the patient and a handout was provided for future reference.  Return in about 3 months (around 02/28/2015) for Blood pressure follow up.

## 2014-11-29 ENCOUNTER — Telehealth: Payer: Self-pay | Admitting: Family Medicine

## 2014-11-29 DIAGNOSIS — E785 Hyperlipidemia, unspecified: Secondary | ICD-10-CM

## 2014-11-29 LAB — PSA: PSA: 0.51 ng/mL (ref <=4.00)

## 2014-11-29 NOTE — Telephone Encounter (Signed)
Pt advised.

## 2014-11-29 NOTE — Telephone Encounter (Signed)
Will you please let patient know that his PSa prostate test, blood sugar, kidney function, liver function, and blood cell counts were normal.  His LDL cholesterol was moderately elevated but not to a degree that requires cholesterol lowering medication.  This can be improved with engaging in 30-45 minutes of moderate exercise most days of the week. I'd recommend f/u in 3 months to recheck blood pressure.

## 2015-02-28 ENCOUNTER — Ambulatory Visit (INDEPENDENT_AMBULATORY_CARE_PROVIDER_SITE_OTHER): Payer: PRIVATE HEALTH INSURANCE | Admitting: Family Medicine

## 2015-02-28 ENCOUNTER — Encounter: Payer: Self-pay | Admitting: Family Medicine

## 2015-02-28 VITALS — BP 135/86 | HR 54 | Wt 226.0 lb

## 2015-02-28 DIAGNOSIS — I10 Essential (primary) hypertension: Secondary | ICD-10-CM | POA: Insufficient documentation

## 2015-02-28 HISTORY — DX: Essential (primary) hypertension: I10

## 2015-02-28 NOTE — Progress Notes (Signed)
CC: Trevor Guerrero is a 55 y.o. male is here for Hypertension   Subjective: HPI:   follow-up essential hypertension: he is trying to reduce sodium in his diet. No new physical activity. No outside blood pressures to report. Denies chest pain shortness of breath orthopnea nor peripheral edema.   Review Of Systems Outlined In HPI  No past medical history on file.  Past Surgical History  Procedure Laterality Date  . Appendectomy    . Left testicular mass Left    Family History  Problem Relation Age of Onset  . Asthma Neg Hx   . Heart failure Neg Hx   . Hypertension Neg Hx     Social History   Social History  . Marital Status: Married    Spouse Name: N/A  . Number of Children: N/A  . Years of Education: N/A   Occupational History  . Not on file.   Social History Main Topics  . Smoking status: Former Smoker    Quit date: 11/16/1979  . Smokeless tobacco: Not on file  . Alcohol Use: No  . Drug Use: No  . Sexual Activity:    Partners: Female   Other Topics Concern  . Not on file   Social History Narrative     Objective: BP 135/86 mmHg  Pulse 54  Wt 226 lb (102.513 kg)  Vital signs reviewed. General: Alert and Oriented, No Acute Distress HEENT: Pupils equal, round, reactive to light. Conjunctivae clear. External ears unremarkable. Moist mucous membranes. Lungs: Clear and comfortable work of breathing, speaking in full sentences without accessory muscle use. Cardiac: Regular rate and rhythm.  Neuro: CN II-XII grossly intact, gait normal. Extremities: No peripheral edema. Strong peripheral pulses.  Mental Status: No depression, anxiety, nor agitation. Logical though process. Skin: Warm and dry.  Assessment & Plan: Trevor Guerrero was seen today for hypertension.  Diagnoses and all orders for this visit:  Essential hypertension   HTN: Controlled with sodium restriction. If BP remains below 140/90 follow up in November for CPE. F/U ASAP if BP climbs above  140/90.  Return if symptoms worsen or fail to improve.

## 2015-05-10 DIAGNOSIS — R079 Chest pain, unspecified: Secondary | ICD-10-CM | POA: Insufficient documentation

## 2015-05-10 DIAGNOSIS — E669 Obesity, unspecified: Secondary | ICD-10-CM | POA: Insufficient documentation

## 2015-05-11 DIAGNOSIS — R911 Solitary pulmonary nodule: Secondary | ICD-10-CM | POA: Insufficient documentation

## 2015-05-18 ENCOUNTER — Ambulatory Visit (INDEPENDENT_AMBULATORY_CARE_PROVIDER_SITE_OTHER): Payer: PRIVATE HEALTH INSURANCE | Admitting: Family Medicine

## 2015-05-18 ENCOUNTER — Encounter: Payer: Self-pay | Admitting: Family Medicine

## 2015-05-18 VITALS — BP 125/77 | HR 46 | Wt 230.0 lb

## 2015-05-18 DIAGNOSIS — I440 Atrioventricular block, first degree: Secondary | ICD-10-CM

## 2015-05-18 DIAGNOSIS — R001 Bradycardia, unspecified: Secondary | ICD-10-CM

## 2015-05-18 HISTORY — DX: Atrioventricular block, first degree: I44.0

## 2015-05-18 NOTE — Progress Notes (Signed)
CC: Trevor Guerrero is a 55 y.o. 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 He had normal troponins along with a normal CBC and CMP. There was concern for cardiac ischemia in the setting of bradycardia and he had a favorable stress test. He additionally had a favorable CT of the brain and pulmonary angiogram. He tells me he felt fine the day of discharge and today still feels fine. He denies any recent remote lightheadedness. He's noticed that his pulse remains in the 40s-50s while resting and this was going on for decades.  He doesn't use in his regular state of health and has no complaints today. Denies fevers, chills, cough, shortness of breath, wheezing, nor chest discomfort. No abdominal pain.   Review Of Systems Outlined In HPI  No past medical history on file.  Past Surgical History  Procedure Laterality Date  . Appendectomy    . Left testicular mass Left    Family History  Problem Relation Age of Onset  . Asthma Neg Hx   . Heart failure Neg Hx   . Hypertension Neg Hx     Social History   Social History  . Marital Status: Married    Spouse Name: N/A  . Number of Children: N/A  . Years of Education: N/A   Occupational History  . Not on file.   Social History Main Topics  . Smoking status: Former Smoker    Quit date: 11/16/1979  . Smokeless tobacco: Not on file  . Alcohol Use: No  . Drug Use: No  . Sexual Activity:    Partners: Female   Other Topics Concern  . Not on file   Social History Narrative     Objective: BP 125/77 mmHg  Pulse 46  Wt 230 lb (104.327 kg)  General: Alert and Oriented, No Acute Distress HEENT: Pupils equal, round, reactive to light. Conjunctivae clear.  Moist mucous membranes Lungs: Clear to auscultation bilaterally, no wheezing/ronchi/rales.  Comfortable work of breathing. Good air movement. Cardiac: Minor  bradycardia normal rhythm. Normal S1/S2.  No murmurs, rubs, nor gallops.   Extremities: No peripheral edema.  Strong peripheral pulses.  Mental Status: No depression, anxiety, nor agitation. Skin: Warm and dry.  Assessment & Plan: Benjermin was seen today for bradycardia.  Diagnoses and all orders for this visit:  Sinus bradycardia -     EKG 12-Lead  First degree AV block -     EKG 12-Lead   EKG was obtained showing sinus bradycardia with first-degree AV block. Reassurance was provided that his imaging and blood work were reassuring while admitted. I also went over with him that his pulse is always been in the bradycardic range when he is visiting [place] in the past. Given lack of symptoms due to bradycardia and no further workup is needed.  25 minutes spent face-to-face during visit today of which at least 50% was counseling or coordinating care regarding: 1. Sinus bradycardia   2. First degree AV block      Return if symptoms worsen or fail to improve.

## 2015-08-30 ENCOUNTER — Encounter: Payer: Self-pay | Admitting: Family Medicine

## 2015-11-28 ENCOUNTER — Ambulatory Visit (INDEPENDENT_AMBULATORY_CARE_PROVIDER_SITE_OTHER): Payer: PRIVATE HEALTH INSURANCE | Admitting: Family Medicine

## 2015-11-28 VITALS — BP 146/78 | HR 57 | Wt 232.0 lb

## 2015-11-28 DIAGNOSIS — N529 Male erectile dysfunction, unspecified: Secondary | ICD-10-CM

## 2015-11-28 DIAGNOSIS — E782 Mixed hyperlipidemia: Secondary | ICD-10-CM

## 2015-11-28 DIAGNOSIS — I10 Essential (primary) hypertension: Secondary | ICD-10-CM

## 2015-11-28 DIAGNOSIS — Z125 Encounter for screening for malignant neoplasm of prostate: Secondary | ICD-10-CM

## 2015-11-28 DIAGNOSIS — Z114 Encounter for screening for human immunodeficiency virus [HIV]: Secondary | ICD-10-CM

## 2015-11-28 DIAGNOSIS — E559 Vitamin D deficiency, unspecified: Secondary | ICD-10-CM | POA: Insufficient documentation

## 2015-11-28 DIAGNOSIS — Z1159 Encounter for screening for other viral diseases: Secondary | ICD-10-CM

## 2015-11-28 DIAGNOSIS — Z0000 Encounter for general adult medical examination without abnormal findings: Secondary | ICD-10-CM

## 2015-11-28 LAB — COMPLETE METABOLIC PANEL WITH GFR
ALBUMIN: 4.6 g/dL (ref 3.6–5.1)
ALK PHOS: 64 U/L (ref 40–115)
ALT: 27 U/L (ref 9–46)
AST: 22 U/L (ref 10–35)
BUN: 21 mg/dL (ref 7–25)
CO2: 22 mmol/L (ref 20–31)
CREATININE: 0.97 mg/dL (ref 0.70–1.33)
Calcium: 9.8 mg/dL (ref 8.6–10.3)
Chloride: 108 mmol/L (ref 98–110)
GFR, EST NON AFRICAN AMERICAN: 87 mL/min (ref >=60)
GFR, Est African American: >89 mL/min (ref >=60)
GLUCOSE: 103 mg/dL — AB (ref 65–99)
Potassium: 4.6 mmol/L (ref 3.5–5.3)
SODIUM: 139 mmol/L (ref 135–146)
TOTAL PROTEIN: 7 g/dL (ref 6.1–8.1)
Total Bilirubin: 0.6 mg/dL (ref 0.2–1.2)

## 2015-11-28 LAB — LIPID PANEL
CHOL/HDL RATIO: 4.8 ratio (ref <5.0)
CHOLESTEROL: 197 mg/dL (ref <200)
HDL: 41 mg/dL (ref >40)
LDL CALC: 137 mg/dL — AB (ref <100)
Triglycerides: 94 mg/dL (ref <150)
VLDL: 19 mg/dL (ref <30)

## 2015-11-28 LAB — CBC
HCT: 42.8 % (ref 38.5–50.0)
Hemoglobin: 14.6 g/dL (ref 13.2–17.1)
MCH: 29.6 pg (ref 27.0–33.0)
MCHC: 34.1 g/dL (ref 32.0–36.0)
MCV: 86.6 fL (ref 80.0–100.0)
MPV: 9.3 fL (ref 7.5–12.5)
PLATELETS: 273 10*3/uL (ref 140–400)
RBC: 4.94 MIL/uL (ref 4.20–5.80)
RDW: 13.7 % (ref 11.0–15.0)
WBC: 5.9 10*3/uL (ref 3.8–10.8)

## 2015-11-28 LAB — PSA: PSA: 0.3 ng/mL (ref <=4.0)

## 2015-11-28 MED ORDER — SILDENAFIL CITRATE 20 MG PO TABS
ORAL_TABLET | ORAL | 3 refills | Status: DC
Start: 2015-11-28 — End: 2015-11-28

## 2015-11-28 MED ORDER — SILDENAFIL CITRATE 20 MG PO TABS
ORAL_TABLET | ORAL | 3 refills | Status: DC
Start: 2015-11-28 — End: 2017-03-20

## 2015-11-28 NOTE — Patient Instructions (Signed)
Thank you for coming in today. Get labs today.  Return in 1 year or sooner if needed.

## 2015-11-28 NOTE — Progress Notes (Signed)
       Trevor Guerrero is a 55 y.o. male who [facility] today for well adult visit. Patient is doing well with no active medical problems. He takes medications listed below. No chest pains palpitations shortness of breath fevers or chills. He notes his blood pressures are typically much higher in the doctor's office and the arm at home.  He notes he had a colonoscopy in 2015 which was reportedly normal.   No past medical history on file. Past Surgical History:  Procedure Laterality Date  . APPENDECTOMY    . left testicular mass Left    Social History  Substance Use Topics  . Smoking status: Former Smoker    Quit date: 11/16/1979  . Smokeless tobacco: Not on file  . Alcohol use No   family history is not on file.  ROS as above:  Medications: Current Outpatient Prescriptions  Medication Sig Dispense Refill  . aspirin 81 MG tablet Take 81 mg by mouth daily.    . sildenafil (REVATIO) 20 MG tablet 2-4 tabs by mouth 30 minutes prior to sex on an as needed basis. 50 tablet 3  . vitamin B-12 (CYANOCOBALAMIN) 1000 MCG tablet Take 1,000 mcg by mouth daily.     No current facility-administered medications for this visit.    No Known Allergies  Health Maintenance Health Maintenance  Topic Date Due  . Hepatitis C Screening  Jul 17, 1960  . HIV Screening  09/16/1974  . COLONOSCOPY  09/15/2009  . TETANUS/TDAP  01/06/2017  . INFLUENZA VACCINE  Completed     Exam:  BP (!) 146/78   Pulse (!) 57   Wt 232 lb (105.2 kg)   BMI 34.26 kg/m  Gen: Well NAD HEENT: EOMI,  MMM Lungs: Normal work of breathing. CTABL Heart: RRR no MRG Abd: NABS, Soft. Nondistended, Nontender Exts: Brisk capillary refill, warm and well perfused.    No results found for this or any previous visit (from the past 72 hour(s)). No results found.    Assessment and Plan: 55 y.o. male  with  Well Adult.   Doing well. Obtain fasting labs listed below. Obtain medical records from gastroenterology regarding colonoscopy. Return for recheck in 1 year or sooner if needed. Recommend home blood pressure logs   Orders Placed This Encounter  Procedures  . CBC  . COMPLETE METABOLIC PANEL WITH GFR  . Hepatitis C antibody  . HIV antibody  . Lipid panel  . VITAMIN D 25 Hydroxy (Vit-D Deficiency, Fractures)  . PSA    Discussed warning signs or symptoms. Please see discharge instructions. Patient expresses understanding.

## 2015-11-29 LAB — VITAMIN D 25 HYDROXY (VIT D DEFICIENCY, FRACTURES): VIT D 25 HYDROXY: 29 ng/mL — AB (ref 30–100)

## 2015-11-29 LAB — HEPATITIS C ANTIBODY: HCV Ab: NEGATIVE

## 2015-11-29 LAB — HIV ANTIBODY (ROUTINE TESTING W REFLEX): HIV 1&2 Ab, 4th Generation: NONREACTIVE

## 2015-12-03 MED ORDER — ATORVASTATIN CALCIUM 20 MG PO TABS: 20.0000 mg | ORAL_TABLET | Freq: Every day | ORAL | 0 refills | Status: DC

## 2015-12-03 NOTE — Addendum Note (Signed)
Addended by: Rodolph Bong on: 12/03/2015 07:27 AM   Modules accepted: Orders

## 2016-02-29 ENCOUNTER — Other Ambulatory Visit: Payer: Self-pay | Admitting: Family Medicine

## 2016-06-04 ENCOUNTER — Other Ambulatory Visit: Payer: Self-pay | Admitting: Family Medicine

## 2016-07-04 ENCOUNTER — Encounter: Payer: Self-pay | Admitting: Family Medicine

## 2016-07-04 ENCOUNTER — Ambulatory Visit (INDEPENDENT_AMBULATORY_CARE_PROVIDER_SITE_OTHER): Payer: PRIVATE HEALTH INSURANCE | Admitting: Family Medicine

## 2016-07-04 VITALS — BP 139/73 | HR 89 | Ht 69.0 in | Wt 236.0 lb

## 2016-07-04 DIAGNOSIS — I10 Essential (primary) hypertension: Secondary | ICD-10-CM | POA: Diagnosis not present

## 2016-07-04 MED ORDER — LISINOPRIL 10 MG PO TABS: 10.0000 mg | ORAL_TABLET | Freq: Every day | ORAL | 1 refills | Status: DC

## 2016-07-04 NOTE — Progress Notes (Signed)
       Barrington Rihn is a 56 y.o. male who presents to [name]: [facility] today for follow-up hypertension.  Sasha has had multiple elevated blood pressures at the local [facility]. He is worried that he will not be able to pass a DOphyT sical. He denies chest pain palpitations or shortness of breath and feels pretty well. He denies any lightheadedness or dizziness. He takes aspirin and B-12 daily. He uses sildenafil occasionally. He also takes Lipitor for cholesterol.   No past medical history on file. Past Surgical History:  Procedure Laterality Date  . APPENDECTOMY    . left testicular mass Left    Social History  Substance Use Topics  . Smoking status: Former Smoker    Quit date: 11/16/1979  . Smokeless tobacco: Never Used  . Alcohol use No   family history is not on file.  ROS as above:  Medications: Current Outpatient Prescriptions  Medication Sig Dispense Refill  . aspirin 81 MG tablet Take 81 mg by mouth daily.    Marland Kitchen atorvastatin (LIPITOR) 20 MG tablet TAKE 1 TABLET (20 MG TOTAL) BY MOUTH DAILY. 90 tablet 1  . sildenafil (REVATIO) 20 MG tablet 2-4 tabs by mouth 30 minutes prior to sex on an as needed basis. 50 tablet 3  . vitamin B-12 (CYANOCOBALAMIN) 1000 MCG tablet Take 1,000 mcg by mouth daily.    Marland Kitchen lisinopril (PRINIVIL,ZESTRIL) 10 MG tablet Take 1 tablet (10 mg total) by mouth daily. 30 tablet 1   No current facility-administered medications for this visit.    No Known Allergies  Health Maintenance Health Maintenance  Topic Date Due  . COLONOSCOPY  09/15/2009  . INFLUENZA VACCINE  08/06/2016  . TETANUS/TDAP  01/06/2017  . Hepatitis C Screening  Completed  . HIV Screening  Completed     Exam:  BP 139/73   Pulse 89   Ht 5\' 9"  (1.753 m)   Wt 236 lb (107 kg)   BMI 34.85 kg/m  Gen: Well NAD HEENT: EOMI,  MMM Lungs: Normal work of breathing. CTABL Heart:  RRR no MRG Abd: NABS, Soft. Nondistended, Nontender Exts: Brisk capillary refill, warm and well perfused.      Chemistry      Component Value Date/Time   NA 139 11/28/2015 0917   K 4.6 11/28/2015 0917   CL 108 11/28/2015 0917   CO2 22 11/28/2015 0917   BUN 21 11/28/2015 0917   CREATININE 0.97 11/28/2015 0917      Component Value Date/Time   CALCIUM 9.8 11/28/2015 0917   ALKPHOS 64 11/28/2015 0917   AST 22 11/28/2015 0917   ALT 27 11/28/2015 0917   BILITOT 0.6 11/28/2015 0917        Assessment and Plan: 56 y.o. male with Hypertension: Plan to start low-dose lisinopril and recheck in one month. We'll check labs at that time. Most recent chemistry was November 2017 and creatinine was okay then.   No orders of the defined types were placed in this encounter.  Meds ordered this encounter  Medications  . lisinopril (PRINIVIL,ZESTRIL) 10 MG tablet    Sig: Take 1 tablet (10 mg total) by mouth daily.    Dispense:  30 tablet    Refill:  1     Discussed warning signs or symptoms. Please see discharge instructions. Patient expresses understanding.

## 2016-07-04 NOTE — Patient Instructions (Signed)
Thank you for coming in today. Start lisinopril daily for blood pressure.  Keep a home blood pressure log with a home blood pressure machine.  Bring the machine and the log in 1 month.  Return in 1 month fasting (TAKE your medicine that day).  You should not feel much different on this medicine.   Lisinopril tablets What is this medicine? LISINOPRIL (lyse IN oh pril) is an ACE inhibitor. This medicine is used to treat high blood pressure and heart failure. It is also used to protect the heart immediately after a heart attack. This medicine may be used for other purposes; ask your health care provider or pharmacist if you have questions. COMMON BRAND NAME(S): Prinivil, Zestril What should I tell my health care provider before I take this medicine? They need to know if you have any of these conditions: -diabetes -heart or blood vessel disease -kidney disease -low blood pressure -previous swelling of the tongue, face, or lips with difficulty breathing, difficulty swallowing, hoarseness, or tightening of the throat -an unusual or allergic reaction to lisinopril, other ACE inhibitors, insect venom, foods, dyes, or preservatives -pregnant or trying to get pregnant -breast-feeding How should I use this medicine? Take this medicine by mouth with a glass of water. Follow the directions on your prescription label. You may take this medicine with or without food. If it upsets your stomach, take it with food. Take your medicine at regular intervals. Do not take it more often than directed. Do not stop taking except on your doctor's advice. Talk to your pediatrician regarding the use of this medicine in children. Special care may be needed. While this drug may be prescribed for children as young as 60 years of age for selected conditions, precautions do apply. Overdosage: If you think you have taken too much of this medicine contact a poison control center or emergency room at once. NOTE: This medicine  is only for you. Do not share this medicine with others. What if I miss a dose? If you miss a dose, take it as soon as you can. If it is almost time for your next dose, take only that dose. Do not take double or extra doses. What may interact with this medicine? Do not take this medicine with any of the following medications: -hymenoptera venom -sacubitril; valsartan This medicines may also interact with the following medications: -aliskiren -angiotensin receptor blockers, like losartan or valsartan -certain medicines for diabetes -diuretics -everolimus -gold compounds -lithium -NSAIDs, medicines for pain and inflammation, like ibuprofen or naproxen -potassium salts or supplements -salt substitutes -sirolimus -temsirolimus This list may not describe all possible interactions. Give your health care provider a list of all the medicines, herbs, non-prescription drugs, or dietary supplements you use. Also tell them if you smoke, drink alcohol, or use illegal drugs. Some items may interact with your medicine. What should I watch for while using this medicine? Visit your doctor or health care professional for regular check ups. Check your blood pressure as directed. Ask your doctor what your blood pressure should be, and when you should contact him or her. Do not treat yourself for coughs, colds, or pain while you are using this medicine without asking your doctor or health care professional for advice. Some ingredients may increase your blood pressure. Women should inform their doctor if they wish to become pregnant or think they might be pregnant. There is a potential for serious side effects to an unborn child. Talk to your health care professional or pharmacist for  more information. Check with your doctor or health care professional if you get an attack of severe diarrhea, nausea and vomiting, or if you sweat a lot. The loss of too much body fluid can make it dangerous for you to take this  medicine. You may get drowsy or dizzy. Do not drive, use machinery, or do anything that needs mental alertness until you know how this drug affects you. Do not stand or sit up quickly, especially if you are an older patient. This reduces the risk of dizzy or fainting spells. Alcohol can make you more drowsy and dizzy. Avoid alcoholic drinks. Avoid salt substitutes unless you are told otherwise by your doctor or health care professional. What side effects may I notice from receiving this medicine? Side effects that you should report to your doctor or health care professional as soon as possible: -allergic reactions like skin rash, itching or hives, swelling of the hands, feet, face, lips, throat, or tongue -breathing problems -signs and symptoms of kidney injury like trouble passing urine or change in the amount of urine -signs and symptoms of increased potassium like muscle weakness; chest pain; or fast, irregular heartbeat -signs and symptoms of liver injury like dark yellow or brown urine; general ill feeling or flu-like symptoms; light-colored stools; loss of appetite; nausea; right upper belly pain; unusually weak or tired; yellowing of the eyes or skin -signs and symptoms of low blood pressure like dizziness; feeling faint or lightheaded, falls; unusually weak or tired -stomach pain with or without nausea and vomiting Side effects that usually do not require medical attention (report to your doctor or health care professional if they continue or are bothersome): -changes in taste -cough -dizziness -fever -headache -sensitivity to light This list may not describe all possible side effects. Call your doctor for medical advice about side effects. You may report side effects to FDA at 1-800-FDA-1088. Where should I keep my medicine? Keep out of the reach of children. Store at room temperature between 15 and 30 degrees C (59 and 86 degrees F). Protect from moisture. Keep container tightly  closed. Throw away any unused medicine after the expiration date. NOTE: This sheet is a summary. It may not cover all possible information. If you have questions about this medicine, talk to your doctor, pharmacist, or health care provider.  2018 Elsevier/Gold Standard (2015-02-12 12:52:35)

## 2016-08-11 ENCOUNTER — Encounter: Payer: Self-pay | Admitting: Family Medicine

## 2016-08-11 ENCOUNTER — Ambulatory Visit (INDEPENDENT_AMBULATORY_CARE_PROVIDER_SITE_OTHER): Payer: PRIVATE HEALTH INSURANCE | Admitting: Family Medicine

## 2016-08-11 VITALS — BP 137/78 | HR 53 | Ht 69.0 in | Wt 248.0 lb

## 2016-08-11 DIAGNOSIS — I10 Essential (primary) hypertension: Secondary | ICD-10-CM

## 2016-08-11 LAB — COMPLETE METABOLIC PANEL WITH GFR
ALBUMIN: 4.4 g/dL (ref 3.6–5.1)
ALK PHOS: 67 U/L (ref 40–115)
ALT: 33 U/L (ref 9–46)
AST: 21 U/L (ref 10–35)
BILIRUBIN TOTAL: 0.5 mg/dL (ref 0.2–1.2)
BUN: 14 mg/dL (ref 7–25)
CO2: 22 mmol/L (ref 20–32)
Calcium: 9.5 mg/dL (ref 8.6–10.3)
Chloride: 102 mmol/L (ref 98–110)
Creat: 0.97 mg/dL (ref 0.70–1.33)
GFR, EST NON AFRICAN AMERICAN: 87 mL/min (ref >=60)
GFR, Est African American: >89 mL/min (ref >=60)
GLUCOSE: 89 mg/dL (ref 65–99)
Potassium: 4.5 mmol/L (ref 3.5–5.3)
SODIUM: 137 mmol/L (ref 135–146)
Total Protein: 6.6 g/dL (ref 6.1–8.1)

## 2016-08-11 NOTE — Patient Instructions (Signed)
Thank you for coming in today. Continue lisinopril.  Get labs today.  If labs are OK I will send in 90 days of lisinopril with 3 refills.  Recheck with me November for a Physical  We can get labs ahead of time.

## 2016-08-11 NOTE — Progress Notes (Signed)
       Trevor Guerrero is a 56 y.o. male who presents to [name] today for follow-up hypertension. Patient has been doing well with home blood pressure measurements typically in the 120s or 130s. He denies chest pain palpitations or shortness of breath. He takes lisinopril daily.   Past Medical History:  Diagnosis Date  . Essential hypertension 02/28/2015   Past Surgical History:  Procedure Laterality Date  . APPENDECTOMY    . left testicular mass Left    Social History  Substance Use Topics  . Smoking status: Former Smoker    Quit date: 11/16/1979  . Smokeless tobacco: Never Used  . Alcohol use No   family history is not on file.  ROS as above:  Medications: Current Outpatient Prescriptions  Medication Sig Dispense Refill  . aspirin 81 MG tablet Take 81 mg by mouth daily.    Marland Kitchen atorvastatin (LIPITOR) 20 MG tablet TAKE 1 TABLET (20 MG TOTAL) BY MOUTH DAILY. 90 tablet 1  . lisinopril (PRINIVIL,ZESTRIL) 10 MG tablet Take 1 tablet (10 mg total) by mouth daily. 30 tablet 1  . sildenafil (REVATIO) 20 MG tablet 2-4 tabs by mouth 30 minutes prior to sex on an as needed basis. 50 tablet 3  . vitamin B-12 (CYANOCOBALAMIN) 1000 MCG tablet Take 1,000 mcg by mouth daily.     No current facility-administered medications for this visit.    No Known Allergies  Health Maintenance Health Maintenance  Topic Date Due  . COLONOSCOPY  09/15/2009  . INFLUENZA VACCINE  08/06/2016  . TETANUS/TDAP  01/06/2017  . Hepatitis C Screening  Completed  . HIV Screening  Completed     Exam:  BP 137/78 Comment: office reading  Pulse (!) 53   Ht 5\' 9"  (1.753 m)   Wt 248 lb (112.5 kg)   BMI 36.62 kg/m   Wt Readings from Last 5 Encounters:  08/11/16 248 lb (112.5 kg)  07/04/16 236 lb (107 kg)  11/28/15 232 lb (105.2 kg)  05/18/15 230 lb (104.3 kg)  02/28/15 226 lb (102.5 kg)     Home BP 152/90  Gen: Well NAD HEENT: EOMI,  MMM Lungs: Normal work of breathing. CTABL Heart: RRR no MRG Abd: NABS, Soft. Nondistended, Nontender Exts: Brisk capillary refill, warm and well perfused.    No results found for this or any previous visit (from the past 72 hour(s)). No results found.    Assessment and Plan: 56 y.o. male with hypertension well-controlled. Plan to continue lisinopril. Check metabolic panel today. If all is well refill the medication for 6 months. Follow up in November for well exam.    Orders Placed This Encounter  Procedures  . COMPLETE METABOLIC PANEL WITH GFR   No orders of the defined types were placed in this encounter.    Discussed warning signs or symptoms. Please see discharge instructions. Patient expresses understanding.

## 2016-08-12 ENCOUNTER — Encounter (INDEPENDENT_AMBULATORY_CARE_PROVIDER_SITE_OTHER): Payer: Self-pay

## 2016-08-12 MED ORDER — LISINOPRIL 10 MG PO TABS: 10.0000 mg | ORAL_TABLET | Freq: Every day | ORAL | 1 refills | Status: DC

## 2016-08-12 NOTE — Addendum Note (Signed)
Addended by: Rodolph Bong on: 08/12/2016 05:29 AM   Modules accepted: Orders

## 2016-08-27 ENCOUNTER — Other Ambulatory Visit: Payer: Self-pay | Admitting: Family Medicine

## 2016-09-01 ENCOUNTER — Ambulatory Visit: Payer: PRIVATE HEALTH INSURANCE | Admitting: Family Medicine

## 2016-09-01 ENCOUNTER — Telehealth: Payer: Self-pay | Admitting: Family Medicine

## 2016-09-01 NOTE — Telephone Encounter (Signed)
Pt called and left a voicemail requesting his BP med be refilled

## 2016-09-02 ENCOUNTER — Ambulatory Visit (INDEPENDENT_AMBULATORY_CARE_PROVIDER_SITE_OTHER): Payer: PRIVATE HEALTH INSURANCE | Admitting: Family Medicine

## 2016-09-02 VITALS — BP 122/75 | HR 56 | Temp 97.6°F | Ht 69.0 in | Wt 238.0 lb

## 2016-09-02 DIAGNOSIS — S39012A Strain of muscle, fascia and tendon of lower back, initial encounter: Secondary | ICD-10-CM | POA: Diagnosis not present

## 2016-09-02 MED ORDER — CYCLOBENZAPRINE HCL 10 MG PO TABS
10.0000 mg | ORAL_TABLET | Freq: Three times a day (TID) | ORAL | 1 refills | Status: DC | PRN
Start: 2016-09-02 — End: 2016-12-02

## 2016-09-02 NOTE — Patient Instructions (Addendum)
Thank you for coming in today. Take up to 2 aleve twice daily for pain.  You can tylenol with it as well.  Come back or go to the emergency room if you notice new weakness new numbness problems walking or bowel or bladder problems. Try to stay active.   TENS UNIT: This is helpful for muscle pain and spasm.   Search and Purchase a TENS 7000 2nd edition at  www.tenspros.com or www.Amazon.com It should be less than $30.     TENS unit instructions: Do not shower or bathe with the unit on Turn the unit off before removing electrodes or batteries If the electrodes lose stickiness add a drop of water to the electrodes after they are disconnected from the unit and place on plastic sheet. If you continued to have difficulty, call the TENS unit company to purchase more electrodes. Do not apply lotion on the skin area prior to use. Make sure the skin is clean and dry as this will help prolong the life of the electrodes. After use, always check skin for unusual red areas, rash or other skin difficulties. If there are any skin problems, does not apply electrodes to the same area. Never remove the electrodes from the unit by pulling the wires. Do not use the TENS unit or electrodes other than as directed. Do not change electrode placement without consultating your therapist or physician. Keep 2 fingers with between each electrode. Wear time ratio is 2:1, on to off times.    For example on for 30 minutes off for 15 minutes and then on for 30 minutes off for 15 minutes      Lumbosacral Strain Lumbosacral strain is an injury that causes pain in the lower back (lumbosacral spine). This injury usually occurs from overstretching the muscles or ligaments along your spine. A strain can affect one or more muscles or cord-like tissues that connect bones to other bones (ligaments). What are the causes? This condition may be caused by:  A hard, direct hit (blow) to the back.  Excessive stretching of the  lower back muscles. This may result from: ? A fall. ? Lifting something heavy. ? Repetitive movements such as bending or crouching.  What increases the risk? The following factors may increase your risk of getting this condition:  Participating in sports or activities that involve: ? A sudden twist of the back. ? Pushing or pulling motions.  Being overweight or obese.  Having poor strength and flexibility, especially tight hamstrings or weak muscles in the back or abdomen.  Having too much of a curve in the lower back.  Having a pelvis that is tilted forward.  What are the signs or symptoms? The main symptom of this condition is pain in the lower back, at the site of the strain. Pain may extend (radiate) down one or both legs. How is this diagnosed? This condition is diagnosed based on:  Your symptoms.  Your medical history.  A physical exam. ? Your health care provider may push on certain areas of your back to determine the source of your pain. ? You may be asked to bend forward, backward, and side to side to assess the severity of your pain and your range of motion.  Imaging tests, such as: ? X-rays. ? MRI.  How is this treated? Treatment for this condition may include:  Putting heat and cold on the affected area.  Medicines to help relieve pain and relax your muscles (muscle relaxants).  NSAIDs to help reduce  swelling and discomfort.  When your symptoms improve, it is important to gradually return to your normal routine as soon as possible to reduce pain, avoid stiffness, and avoid loss of muscle strength. Generally, symptoms should improve within 6 weeks of treatment. However, recovery time varies. Follow these instructions at home: Managing pain, stiffness, and swelling   If directed, put ice on the injured area during the first 24 hours after your strain. ? Put ice in a plastic bag. ? Place a towel between your skin and the bag. ? Leave the ice on for 20  minutes, 2-3 times a day.  If directed, put heat on the affected area as often as told by your health care provider. Use the heat source that your health care provider recommends, such as a moist heat pack or a heating pad. ? Place a towel between your skin and the heat source. ? Leave the heat on for 20-30 minutes. ? Remove the heat if your skin turns bright red. This is especially important if you are unable to feel pain, heat, or cold. You may have a greater risk of getting burned. Activity  Rest and return to your normal activities as told by your health care provider. Ask your health care provider what activities are safe for you.  Avoid activities that take a lot of energy for as long as told by your health care provider. General instructions  Take over-the-counter and prescription medicines only as told by your health care provider.  Donot drive or use heavy machinery while taking prescription pain medicine.  Do not use any products that contain nicotine or tobacco, such as cigarettes and e-cigarettes. If you need help quitting, ask your health care provider.  Keep all follow-up visits as told by your health care provider. This is important. How is this prevented?  Use correct form when playing sports and lifting heavy objects.  Use good posture when sitting and standing.  Maintain a healthy weight.  Sleep on a mattress with medium firmness to support your back.  Be safe and responsible while being active to avoid falls.  Do at least 150 minutes of moderate-intensity exercise each week, such as brisk walking or water aerobics. Try a form of exercise that takes stress off your back, such as swimming or stationary cycling.  Maintain physical fitness, including: ? Strength. ? Flexibility. ? Cardiovascular fitness. ? Endurance. Contact a health care provider if:  Your back pain does not improve after 6 weeks of treatment.  Your symptoms get worse. Get help right away  if:  Your back pain is severe.  You cannot stand or walk.  You have difficulty controlling when you urinate or when you have a bowel movement.  You feel nauseous or you vomit.  Your feet get very cold.  You have numbness, tingling, weakness, or problems using your arms or legs.  You develop any of the following: ? Shortness of breath. ? Dizziness. ? Pain in your legs. ? Weakness in your buttocks or legs. ? Discoloration of the skin on your toes or legs. This information is not intended to replace advice given to you by your health care provider. Make sure you discuss any questions you have with your health care provider. Document Released: 10/02/2004 Document Revised: 07/13/2015 Document Reviewed: 05/27/2015 Elsevier Interactive Patient Education  2017 [employer].

## 2016-09-02 NOTE — Telephone Encounter (Signed)
Rx has been sent. Left VM advising Pt.

## 2016-09-02 NOTE — Progress Notes (Signed)
Trevor Guerrero is a 56 y.o. male who presents to [facility] today for back pain. Patient notes a several day history of right low back pain. The pain does not radiate. He denies any injury. He denies any bowel or bladder dysfunction weakness or numbness. Pain is worse with standing from a seated position and turning. He's tried some over-the-counter medicines for pain which have not been very helpful. No fevers or chills vomiting or diarrhea.   Past Medical History:  Diagnosis Date  . Essential hypertension 02/28/2015   Past Surgical History:  Procedure Laterality Date  . APPENDECTOMY    . left testicular mass Left    Social History  Substance Use Topics  . Smoking status: Former Smoker    Quit date: 11/16/1979  . Smokeless tobacco: Never Used  . Alcohol use No     ROS:  As above   Medications: Current Outpatient Prescriptions  Medication Sig Dispense Refill  . aspirin 81 MG tablet Take 81 mg by mouth daily.    Marland Kitchen atorvastatin (LIPITOR) 20 MG tablet TAKE 1 TABLET (20 MG TOTAL) BY MOUTH DAILY. 90 tablet 1  . cyclobenzaprine (FLEXERIL) 10 MG tablet Take 1 tablet (10 mg total) by mouth 3 (three) times daily as needed for muscle spasms. 30 tablet 1  . lisinopril (PRINIVIL,ZESTRIL) 10 MG tablet Take 1 tablet (10 mg total) by mouth daily. 90 tablet 1  . sildenafil (REVATIO) 20 MG tablet 2-4 tabs by mouth 30 minutes prior to sex on an as needed basis. 50 tablet 3  . vitamin B-12 (CYANOCOBALAMIN) 1000 MCG tablet Take 1,000 mcg by mouth daily.     No current facility-administered medications for this visit.    No Known Allergies   Exam:  BP 122/75 (BP Location: Left Arm, Patient Position: Sitting, Cuff Size: Large)   Pulse (!) 56   Temp 97.6 F (36.4 C) (Oral)   Ht 5\' 9"  (1.753 m)   Wt 238 lb (108 kg)   SpO2 97%   BMI 35.15 kg/m  General: Well Developed, well nourished, and in no acute distress.  Neuro/Psych: Alert and oriented  x3, extra-ocular muscles intact, able to move all 4 extremities, sensation grossly intact. Skin: Warm and dry, no rashes noted.  Respiratory: Not using accessory muscles, speaking in full sentences, trachea midline.  Cardiovascular: Pulses palpable, no extremity edema. Abdomen: Does not appear distended. MSK:  L spine: Nontender to spinal midline. Tender palpation right lumbar paraspinal muscle group. Range of motion status throughout and somewhat limited. Decreased extension. Normal flexion. Normal rotation. Normal lateral flexion but pain with right lateral flexion. Lower extremity strength reflexes and sensation are intact and equal throughout      No results found for this or any previous visit (from the past 48 hour(s)). No results found.    Assessment and Plan: 56 y.o. male with lumbosacral strain. Likely of the quadratus lumborum muscle group. Plan for referral to physical therapy and prescription Flexeril. Also use prescription strength Aleve, heating pad and TENS unit. Recheck if not improving.   Orders Placed This Encounter  Procedures  . Ambulatory referral to Physical Therapy    Referral Priority:   Routine    Referral Type:   Physical Medicine    Referral Reason:   Specialty Services Required    Requested Specialty:   Physical Therapy   Meds ordered this encounter  Medications  . cyclobenzaprine (FLEXERIL) 10 MG tablet    Sig: Take 1 tablet (10  mg total) by mouth 3 (three) times daily as needed for muscle spasms.    Dispense:  30 tablet    Refill:  1    Discussed warning signs or symptoms. Please see discharge instructions. Patient expresses understanding.  I spent 25 minutes with this patient, greater than 50% was face-to-face time counseling regarding differential diagnosis prognosis and exercise program.

## 2016-12-01 ENCOUNTER — Encounter: Payer: PRIVATE HEALTH INSURANCE | Admitting: Family Medicine

## 2016-12-02 ENCOUNTER — Encounter: Payer: Self-pay | Admitting: Family Medicine

## 2016-12-02 ENCOUNTER — Ambulatory Visit (INDEPENDENT_AMBULATORY_CARE_PROVIDER_SITE_OTHER): Payer: PRIVATE HEALTH INSURANCE

## 2016-12-02 ENCOUNTER — Ambulatory Visit (INDEPENDENT_AMBULATORY_CARE_PROVIDER_SITE_OTHER): Payer: PRIVATE HEALTH INSURANCE | Admitting: Family Medicine

## 2016-12-02 VITALS — BP 132/79 | HR 59 | Ht 69.0 in | Wt 242.0 lb

## 2016-12-02 DIAGNOSIS — Z23 Encounter for immunization: Secondary | ICD-10-CM

## 2016-12-02 DIAGNOSIS — I10 Essential (primary) hypertension: Secondary | ICD-10-CM

## 2016-12-02 DIAGNOSIS — E782 Mixed hyperlipidemia: Secondary | ICD-10-CM

## 2016-12-02 DIAGNOSIS — Z0000 Encounter for general adult medical examination without abnormal findings: Secondary | ICD-10-CM | POA: Diagnosis not present

## 2016-12-02 DIAGNOSIS — R739 Hyperglycemia, unspecified: Secondary | ICD-10-CM

## 2016-12-02 DIAGNOSIS — Z125 Encounter for screening for malignant neoplasm of prostate: Secondary | ICD-10-CM | POA: Diagnosis not present

## 2016-12-02 DIAGNOSIS — M25562 Pain in left knee: Secondary | ICD-10-CM | POA: Diagnosis not present

## 2016-12-02 DIAGNOSIS — E559 Vitamin D deficiency, unspecified: Secondary | ICD-10-CM

## 2016-12-02 MED ORDER — DICLOFENAC SODIUM 1 % TD GEL: 4.0000 g | Freq: Four times a day (QID) | TRANSDERMAL | 11 refills | Status: DC

## 2016-12-02 NOTE — Progress Notes (Signed)
Trevor Guerrero is a 56 y.o. male who presents to [facility]: Primary Care Sports Medicine today for well adult. Trevor Guerrero is doing well overall. He takes medications listed below, and tries to eat a balanced diet. He notes new onset of left knee pain. He felt a pop while rolling over in bed and had some pain and swelling that slightly improving now. The pain is worse with standing from a seated position and prolonged standing. He denies any instability locking or catching. He feels well overall. He tolerates his medications well and is happy and satisfied with his life.   Past Medical History:  Diagnosis Date  . Essential hypertension 02/28/2015   Past Surgical History:  Procedure Laterality Date  . APPENDECTOMY    . left testicular mass Left    Social History   Tobacco Use  . Smoking status: Former Smoker    Last attempt to quit: 11/16/1979    Years since quitting: 37.0  . Smokeless tobacco: Never Used  Substance Use Topics  . Alcohol use: No    Alcohol/week: 0.0 oz   family history is not on file.  ROS as above:  Medications: Current Outpatient Medications  Medication Sig Dispense Refill  . aspirin 81 MG tablet Take 81 mg by mouth daily.    Marland Kitchen atorvastatin (LIPITOR) 20 MG tablet TAKE 1 TABLET (20 MG TOTAL) BY MOUTH DAILY. 90 tablet 1  . lisinopril (PRINIVIL,ZESTRIL) 10 MG tablet Take 1 tablet (10 mg total) by mouth daily. 90 tablet 1  . sildenafil (REVATIO) 20 MG tablet 2-4 tabs by mouth 30 minutes prior to sex on an as needed basis. 50 tablet 3  . vitamin B-12 (CYANOCOBALAMIN) 1000 MCG tablet Take 1,000 mcg by mouth daily.    . diclofenac sodium (VOLTAREN) 1 % GEL Apply 4 g topically 4 (four) times daily. To affected joint. 100 g 11   No current facility-administered medications for this visit.    No Known Allergies  Health Maintenance Health Maintenance  Topic Date Due  .  COLONOSCOPY  09/15/2009  . TETANUS/TDAP  12/03/2026  . INFLUENZA VACCINE  Completed  . Hepatitis C Screening  Completed  . HIV Screening  Completed     Exam:  BP 132/79   Pulse (!) 59   Ht 5\' 9"  (1.753 m)   Wt 242 lb (109.8 kg)   BMI 35.74 kg/m  Gen: Well NAD HEENT: EOMI,  MMM Lungs: Normal work of breathing. CTABL Heart: RRR no MRG Abd: NABS, Soft. Nondistended, Nontender Exts: Brisk capillary refill, warm and well perfused.  Left knee mild effusion no erythema. Tender to palpation medial joint line Range of motion 0-120. Stable ligamentous exam. Positive medial McMurray's test. Normal gait and strength. Skin: Several cherry angiomas otherwise no other abnormalities. No dysplastic appearing nevi.   No results found for this or any previous visit (from the past 72 hour(s)). No results found.    Assessment and Plan: 56 y.o. male with  Well adult. Doing reasonably well. Plan to check basic fasting labs listed below to follow-up existing chronic medical problems. Will obtain records of colonoscopy performed in 2015.  [name] has for knee pain suspect DJD versus degenerative meniscus tear. Plan for xray and trial of voltaren gel.  Discussed injections.   Recheck in 6-12 months.  Tdap given prior to discharge.    Orders Placed This Encounter  Procedures  . DG Knee 1-2 Views Right    Standing Status:  Future    Standing Expiration Date:   02/02/2018    Order Specific Question:   Reason for Exam (SYMPTOM  OR DIAGNOSIS REQUIRED)    Answer:   For use with the left knee x-ray bilateral AP and Rosenberg standing.    Order Specific Question:   Preferred imaging location?    [facility]  . DG Knee Complete 4 Views Left    Please include patellar sunrise, lateral, and weightbearing bilateral AP and bilateral rosenberg views    Standing Status:   Future    Standing Expiration Date:   02/01/2018    Order Specific Question:   Reason for exam:    Answer:    Please include patellar sunrise, lateral, and weightbearing bilateral AP and bilateral rosenberg views    Comments:   Please include patellar sunrise, lateral, and weightbearing bilateral AP and bilateral rosenberg views    Order Specific Question:   Preferred imaging location?    [facility]  . Tdap vaccine greater than or equal to 7yo IM  . CBC  . Lipid Panel w/reflex Direct LDL  . VITAMIN D 25 Hydroxy (Vit-D Deficiency, Fractures)  . Hemoglobin A1c  . PSA  . COMPLETE METABOLIC PANEL WITH GFR   Meds ordered this encounter  Medications  . diclofenac sodium (VOLTAREN) 1 % GEL    Sig: Apply 4 g topically 4 (four) times daily. To affected joint.    Dispense:  100 g    Refill:  11     Discussed warning signs or symptoms. Please see discharge instructions. Patient expresses understanding.

## 2016-12-02 NOTE — Patient Instructions (Addendum)
Thank you for coming in today. Get xray and labs today.  Apply the gel up to 4x daily.  If not better return for injection.  I will get results to you within 2 days of xray and labs.  We will get records of colonoscopy.

## 2016-12-03 LAB — PSA: PSA: 0.5 ng/mL (ref <=4.0)

## 2016-12-03 LAB — COMPLETE METABOLIC PANEL WITH GFR
AG Ratio: 2.2 (calc) (ref 1.0–2.5)
ALBUMIN MSPROF: 4.8 g/dL (ref 3.6–5.1)
ALKALINE PHOSPHATASE (APISO): 69 U/L (ref 40–115)
ALT: 35 U/L (ref 9–46)
AST: 24 U/L (ref 10–35)
BUN: 23 mg/dL (ref 7–25)
CHLORIDE: 104 mmol/L (ref 98–110)
CO2: 27 mmol/L (ref 20–32)
Calcium: 10.1 mg/dL (ref 8.6–10.3)
Creat: 1.16 mg/dL (ref 0.70–1.33)
GFR, EST NON AFRICAN AMERICAN: 70 mL/min/{1.73_m2} (ref >=60)
GFR, Est African American: 81 mL/min/{1.73_m2} (ref >=60)
GLOBULIN: 2.2 g/dL (ref 1.9–3.7)
Glucose, Bld: 92 mg/dL (ref 65–99)
Potassium: 4.8 mmol/L (ref 3.5–5.3)
SODIUM: 138 mmol/L (ref 135–146)
Total Bilirubin: 0.7 mg/dL (ref 0.2–1.2)
Total Protein: 7 g/dL (ref 6.1–8.1)

## 2016-12-03 LAB — LIPID PANEL W/REFLEX DIRECT LDL
CHOLESTEROL: 139 mg/dL (ref <200)
HDL: 44 mg/dL (ref >40)
LDL Cholesterol (Calc): 77 mg/dL (calc)
Non-HDL Cholesterol (Calc): 95 mg/dL (calc) (ref <130)
TRIGLYCERIDES: 95 mg/dL (ref <150)
Total CHOL/HDL Ratio: 3.2 (calc) (ref <5.0)

## 2016-12-03 LAB — CBC
HCT: 43.3 % (ref 38.5–50.0)
Hemoglobin: 14.8 g/dL (ref 13.2–17.1)
MCH: 29.5 pg (ref 27.0–33.0)
MCHC: 34.2 g/dL (ref 32.0–36.0)
MCV: 86.3 fL (ref 80.0–100.0)
MPV: 9.4 fL (ref 7.5–12.5)
PLATELETS: 290 10*3/uL (ref 140–400)
RBC: 5.02 10*6/uL (ref 4.20–5.80)
RDW: 12.6 % (ref 11.0–15.0)
WBC: 6.3 10*3/uL (ref 3.8–10.8)

## 2016-12-03 LAB — HEMOGLOBIN A1C
EAG (MMOL/L): 6 (calc)
Hgb A1c MFr Bld: 5.4 % of total Hgb (ref <5.7)
MEAN PLASMA GLUCOSE: 108 (calc)

## 2016-12-03 LAB — VITAMIN D 25 HYDROXY (VIT D DEFICIENCY, FRACTURES): Vit D, 25-Hydroxy: 44 ng/mL (ref 30–100)

## 2016-12-18 ENCOUNTER — Other Ambulatory Visit: Payer: Self-pay | Admitting: Family Medicine

## 2016-12-22 ENCOUNTER — Encounter: Payer: Self-pay | Admitting: *Deleted

## 2017-02-01 ENCOUNTER — Other Ambulatory Visit: Payer: Self-pay | Admitting: Family Medicine

## 2017-03-01 ENCOUNTER — Other Ambulatory Visit: Payer: Self-pay | Admitting: Family Medicine

## 2017-03-20 ENCOUNTER — Other Ambulatory Visit: Payer: Self-pay | Admitting: Family Medicine

## 2017-04-14 ENCOUNTER — Other Ambulatory Visit: Payer: Self-pay | Admitting: Family Medicine

## 2017-06-09 ENCOUNTER — Ambulatory Visit (INDEPENDENT_AMBULATORY_CARE_PROVIDER_SITE_OTHER): Payer: PRIVATE HEALTH INSURANCE

## 2017-06-09 ENCOUNTER — Encounter: Payer: Self-pay | Admitting: Family Medicine

## 2017-06-09 ENCOUNTER — Ambulatory Visit (INDEPENDENT_AMBULATORY_CARE_PROVIDER_SITE_OTHER): Payer: PRIVATE HEALTH INSURANCE | Admitting: Family Medicine

## 2017-06-09 VITALS — BP 133/75 | HR 53 | Ht 69.0 in | Wt 243.0 lb

## 2017-06-09 DIAGNOSIS — R1319 Other dysphagia: Secondary | ICD-10-CM | POA: Diagnosis not present

## 2017-06-09 DIAGNOSIS — K219 Gastro-esophageal reflux disease without esophagitis: Secondary | ICD-10-CM

## 2017-06-09 DIAGNOSIS — J984 Other disorders of lung: Secondary | ICD-10-CM | POA: Diagnosis not present

## 2017-06-09 DIAGNOSIS — R1110 Vomiting, unspecified: Secondary | ICD-10-CM | POA: Diagnosis not present

## 2017-06-09 MED ORDER — OMEPRAZOLE 40 MG PO CPDR: 40.0000 mg | DELAYED_RELEASE_CAPSULE | Freq: Two times a day (BID) | ORAL | 3 refills | Status: DC

## 2017-06-09 NOTE — Patient Instructions (Addendum)
Thank you for coming in today. Start omeprazole twice daily.  Be careful with food.  Recheck with me as needed.  Get xray now.  You should hear from gastroenterology soon.  If you do not hear anything let me know.    Esophageal Stricture Esophageal stricture is a condition that causes the esophagus to become narrow. The esophagus is the long tube in your throat that carries food and liquid from your mouth to your stomach. Esophageal stricture can make it difficult, painful, or even impossible to swallow. The condition also makes choking more likely. What are the causes? Gastroesophageal reflux disease (GERD) is the most common cause of esophageal stricture. In GERD, stomach acid backs up into the esophagus. Over time, this causes scar tissue and leads to narrowing (stricture). Other causes of esophageal stricture include:  Scarring from ingesting a harmful substance.  Damage from medical instruments used in the esophagus.  Radiation therapy.  Cancer.  What increases the risk? You are at greater risk for esophageal stricture if you have GERD or esophageal cancer. What are the signs or symptoms? Signs and symptoms of esophageal stricture include:  Difficulty swallowing.  Pain when swallowing.  Heartburn.  Vomiting or spitting up (regurgitating) food or liquids.  Weight loss.  How is this diagnosed? Your health care provider may suspect esophageal stricture based on your symptoms. A physical exam will also be done. You may need tests to confirm the diagnosis. These can include:  Upper endoscopy. Your health care provider will insert a flexible tube with a tiny camera on it (endoscope) into your esophagus to check for a stricture. A tissue sample may also be taken to be examined under a microscope (biopsy).  Esophageal pH monitoring. This test involves collecting acid in the esophagus with a tube to determine how much stomach acid is entering the esophagus.  Barium swallow  test. For this test, you will drink a barium solution that coats the lining of the esophagus. Then you will have an X-ray taken. The barium solution helps to show if there is stricture.  How is this treated? Treatment for esophageal stricture depends on what is causing your condition and how severe it is. Treatment options include:  Esophageal dilatation. In this procedure, a health care provider inserts an endoscope or a tool called a dilator into your esophagus to gently stretch it and make the opening wider.  Stents. In some cases, your health care provider may place a small device (stent) in the esophagus to keep it open.  Acid-blocking medicines. Taking these helps manage GERD symptoms after an esophageal stricture. This can prevent the stricture from returning.  Follow these instructions at home:  Do not drink alcohol.  Do not use any tobacco products, including cigarettes, chewing tobacco, or electronic cigarettes. If you need help quitting, ask your health care provider.  Lose weight if you are overweight.  Wear loose, comfortable clothing.  Do not eat for 3 hours before bedtime.  Elevate your head in bed with pillows.  Do not overeat at meals.  Do not eat foods that make reflux worse. These include: ? Fatty foods. ? Spicy foods. ? Soda. ? Tomato products. ? Chocolate. Contact a health care provider if:  You have problems eating or swallowing.  You regurgitate food and liquid. This information is not intended to replace advice given to you by your health care provider. Make sure you discuss any questions you have with your health care provider. Document Released: 09/02/2005 Document Revised: 05/31/2015 Document  Reviewed: 05/04/2013 Elsevier Interactive Patient Education  2018 [employer]   Hiatal Hernia A hiatal hernia occurs when part of the stomach slides above the muscle that separates the abdomen from the chest (diaphragm). A person can be born with a hiatal  hernia (congenital), or it may develop over time. In almost all cases of hiatal hernia, only the top part of the stomach pushes through the diaphragm. Many people have a hiatal hernia with no symptoms. The larger the hernia, the more likely it is that you will have symptoms. In some cases, a hiatal hernia allows stomach acid to flow back into the tube that carries food from your mouth to your stomach (esophagus). This may cause heartburn symptoms. Severe heartburn symptoms may mean that you have developed a condition called gastroesophageal reflux disease (GERD). What are the causes? This condition is caused by a weakness in the opening (hiatus) where the esophagus passes through the diaphragm to attach to the upper part of the stomach. A person may be born with a weakness in the hiatus, or a weakness can develop over time. What increases the risk? This condition is more likely to develop in:  Older people. Age is a major risk factor for a hiatal hernia, especially if you are over the age of 48.  Pregnant women.  People who are overweight.  People who have frequent constipation.  What are the signs or symptoms? Symptoms of this condition usually develop in the form of GERD symptoms. Symptoms include:  Heartburn.  Belching.  Indigestion.  Trouble swallowing.  Coughing or wheezing.  Sore throat.  Hoarseness.  Chest pain.  Nausea and vomiting.  How is this diagnosed? This condition may be diagnosed during testing for GERD. Tests that may be done include:  X-rays of your stomach or chest.  An upper gastrointestinal (GI) series. This is an X-ray exam of your GI tract that is taken after you swallow a chalky liquid that shows up clearly on the X-ray.  Endoscopy. This is a procedure to look into your stomach using a thin, flexible tube that has a tiny camera and light on the end of it.  How is this treated? This condition may be treated by:  Dietary and lifestyle changes to  help reduce GERD symptoms.  Medicines. These may include: ? Over-the-counter antacids. ? Medicines that make your stomach empty more quickly. ? Medicines that block the production of stomach acid (H2 blockers). ? Stronger medicines to reduce stomach acid (proton pump inhibitors).  Surgery to repair the hernia, if other treatments are not helping.  If you have no symptoms, you may not need treatment. Follow these instructions at home: Lifestyle and activity  Do not use any products that contain nicotine or tobacco, such as cigarettes and e-cigarettes. If you need help quitting, ask your health care provider.  Try to achieve and maintain a healthy body weight.  Avoid putting pressure on your abdomen. Anything that puts pressure on your abdomen increases the amount of acid that may be pushed up into your esophagus. ? Avoid bending over, especially after eating. ? Raise the head of your bed by putting blocks under the legs. This keeps your head and esophagus higher than your stomach. ? Do not wear tight clothing around your chest or stomach. ? Try not to strain when having a bowel movement, when urinating, or when lifting heavy objects. Eating and drinking  Avoid foods that can worsen GERD symptoms. These may include: ? Fatty foods, like fried foods. ?  Citrus fruits, like oranges or lemon. ? Other foods and drinks that contain acid, like orange juice or tomatoes. ? Spicy food. ? Chocolate.  Eat frequent small meals instead of three large meals a day. This helps prevent your stomach from getting too full. ? Eat slowly. ? Do not lie down right after eating. ? Do not eat 1-2 hours before bed.  Do not drink beverages with caffeine. These include cola, coffee, cocoa, and tea.  Do not drink alcohol. General instructions  Take over-the-counter and prescription medicines only as told by your health care provider.  Keep all follow-up visits as told by your health care provider. This is  important. Contact a health care provider if:  Your symptoms are not controlled with medicines or lifestyle changes.  You are having trouble swallowing.  You have coughing or wheezing that will not go away. Get help right away if:  Your pain is getting worse.  Your pain spreads to your arms, neck, jaw, teeth, or back.  You have shortness of breath.  You sweat for no reason.  You feel sick to your stomach (nauseous) or you vomit.  You vomit blood.  You have bright red blood in your stools.  You have black, tarry stools. This information is not intended to replace advice given to you by your health care provider. Make sure you discuss any questions you have with your health care provider. Document Released: 03/15/2003 Document Revised: 12/17/2015 Document Reviewed: 12/17/2015 Elsevier Interactive Patient Education  [employer].

## 2017-06-09 NOTE — Progress Notes (Signed)
Trevor Guerrero is a 57 y.o. male who presents to [facility]: Primary Care Sports Medicine today for dysphasia.  Trevor Guerrero notes a 2-week history of regurgitation and acid reflux and dysphasia.  He notes he has a sensation that food gets stuck when he swallows it.  He occasionally has had regurgitation.  This is been present for about 2 weeks and occurs almost daily.  The symptoms will happen with solids and liquids.  He is being careful to chew his food thoroughly. He is not taking any current medications for acid reflux   ROS as above:  Exam:  BP 133/75   Pulse (!) 53   Ht 5\' 9"  (1.753 m)   Wt 243 lb (110.2 kg)   BMI 35.88 kg/m  Gen: Well NAD HEENT: EOMI,  MMM Lungs: Normal work of breathing. CTABL Heart: RRR no MRG Abd: NABS, Soft. Nondistended, Nontender Exts: Brisk capillary refill, warm and well perfused.   Lab and Radiology Results X-ray acute abdomen and chest images personally independently reviewed: No acute findings. Awaiting formal radiology review.   Assessment and Plan: 57 y.o. male with dysphasia and regurgitation.  Concerning for esophageal stricture versus hiatal hernia.  Doubtful just globus sensation.  Plan for empiric treatment with omeprazole twice daily.  Plan refer to gastroneurology as patient will benefit from EGD.  Recheck with me in the near future as needed.  Careful discussion of risks and precautions.   Orders Placed This Encounter  Procedures  . DG Abd Acute W/Chest    Standing Status:   Future    Number of Occurrences:   1    Standing Expiration Date:   08/10/2018    Order Specific Question:   Reason for Exam (SYMPTOM  OR DIAGNOSIS REQUIRED)    Answer:   eval abd pain and reflux. Concern hialital hernia    Order Specific Question:   Preferred imaging location?    [facility]    Order Specific Question:   Radiology Contrast Protocol - do NOT  remove file path    Answer:   [email or web address]  . Ambulatory referral to Gastroenterology    Referral Priority:   Routine    Referral Type:   Consultation    Referral Reason:   Specialty Services Required    Number of Visits Requested:   1   Meds ordered this encounter  Medications  . omeprazole (PRILOSEC) 40 MG capsule    Sig: Take 1 capsule (40 mg total) by mouth 2 (two) times daily.    Dispense:  60 capsule    Refill:  3     Historical information moved to improve visibility of documentation.  Past Medical History:  Diagnosis Date  . Essential hypertension 02/28/2015   Past Surgical History:  Procedure Laterality Date  . APPENDECTOMY    . left testicular mass Left    Social History   Tobacco Use  . Smoking status: Former Smoker    Last attempt to quit: 11/16/1979    Years since quitting: 37.5  . Smokeless tobacco: Never Used  Substance Use Topics  . Alcohol use: No    Alcohol/week: 0.0 oz   family history is not on file.  Medications: Current Outpatient Medications  Medication Sig Dispense Refill  . aspirin 81 MG tablet Take 81 mg by mouth daily.    Marland Kitchen atorvastatin (LIPITOR) 20 MG tablet TAKE 1 TABLET (20 MG TOTAL) BY MOUTH DAILY. 90 tablet 1  . diclofenac  sodium (VOLTAREN) 1 % GEL Apply 4 g topically 4 (four) times daily. To affected joint. 100 g 11  . lisinopril (PRINIVIL,ZESTRIL) 10 MG tablet TAKE 1 TABLET BY MOUTH EVERY DAY 30 tablet 7  . sildenafil (REVATIO) 20 MG tablet TAKE 2 TO 4 TABLETS BY MOUTH 30 MINUTES PRIOR TO SEX ON AN AS NEEDED BASIS. 50 tablet 12  . vitamin B-12 (CYANOCOBALAMIN) 1000 MCG tablet Take 1,000 mcg by mouth daily.    Marland Kitchen omeprazole (PRILOSEC) 40 MG capsule Take 1 capsule (40 mg total) by mouth 2 (two) times daily. 60 capsule 3   No current facility-administered medications for this visit.    No Known Allergies  Health Maintenance Health Maintenance  Topic Date Due  . INFLUENZA VACCINE   08/06/2017  . COLONOSCOPY  01/03/2024  . TETANUS/TDAP  12/03/2026  . Hepatitis C Screening  Completed  . HIV Screening  Completed    Discussed warning signs or symptoms. Please see discharge instructions. Patient expresses understanding.

## 2017-06-10 ENCOUNTER — Telehealth: Payer: Self-pay | Admitting: Family Medicine

## 2017-06-10 DIAGNOSIS — R911 Solitary pulmonary nodule: Secondary | ICD-10-CM

## 2017-06-10 NOTE — Telephone Encounter (Signed)
Chest CT ordered to follow up lung nodule and hiatal hernia

## 2017-06-10 NOTE — Telephone Encounter (Signed)
Left message on patient vm advising information as noted below. Rhonda Cunningham,CMA

## 2017-06-17 ENCOUNTER — Ambulatory Visit (INDEPENDENT_AMBULATORY_CARE_PROVIDER_SITE_OTHER): Payer: PRIVATE HEALTH INSURANCE

## 2017-06-17 DIAGNOSIS — R911 Solitary pulmonary nodule: Secondary | ICD-10-CM

## 2017-06-30 ENCOUNTER — Encounter: Payer: Self-pay | Admitting: Family Medicine

## 2017-06-30 NOTE — Progress Notes (Signed)
EGD report from [facility] received.  Biopsy pending.  Empiric dilation performed.

## 2017-07-16 ENCOUNTER — Other Ambulatory Visit: Payer: Self-pay | Admitting: Family Medicine

## 2017-12-01 ENCOUNTER — Other Ambulatory Visit: Payer: Self-pay | Admitting: Family Medicine

## 2017-12-07 ENCOUNTER — Ambulatory Visit (INDEPENDENT_AMBULATORY_CARE_PROVIDER_SITE_OTHER): Payer: PRIVATE HEALTH INSURANCE | Admitting: Family Medicine

## 2017-12-07 ENCOUNTER — Encounter: Payer: Self-pay | Admitting: Family Medicine

## 2017-12-07 VITALS — BP 146/69 | HR 59 | Ht 69.0 in | Wt 252.0 lb

## 2017-12-07 DIAGNOSIS — I440 Atrioventricular block, first degree: Secondary | ICD-10-CM

## 2017-12-07 DIAGNOSIS — Z0000 Encounter for general adult medical examination without abnormal findings: Secondary | ICD-10-CM | POA: Diagnosis not present

## 2017-12-07 DIAGNOSIS — E8801 Alpha-1-antitrypsin deficiency: Secondary | ICD-10-CM

## 2017-12-07 DIAGNOSIS — R911 Solitary pulmonary nodule: Secondary | ICD-10-CM

## 2017-12-07 DIAGNOSIS — Z8349 Family history of other endocrine, nutritional and metabolic diseases: Secondary | ICD-10-CM | POA: Diagnosis not present

## 2017-12-07 DIAGNOSIS — Z125 Encounter for screening for malignant neoplasm of prostate: Secondary | ICD-10-CM

## 2017-12-07 DIAGNOSIS — E782 Mixed hyperlipidemia: Secondary | ICD-10-CM

## 2017-12-07 DIAGNOSIS — I10 Essential (primary) hypertension: Secondary | ICD-10-CM

## 2017-12-07 DIAGNOSIS — E559 Vitamin D deficiency, unspecified: Secondary | ICD-10-CM

## 2017-12-07 HISTORY — DX: Family history of other endocrine, nutritional and metabolic diseases: Z83.49

## 2017-12-07 MED ORDER — OMEPRAZOLE 40 MG PO CPDR: 40.0000 mg | DELAYED_RELEASE_CAPSULE | Freq: Two times a day (BID) | ORAL | 3 refills | Status: DC

## 2017-12-07 MED ORDER — LISINOPRIL 10 MG PO TABS: 10.0000 mg | ORAL_TABLET | Freq: Every day | ORAL | 3 refills | Status: DC

## 2017-12-07 MED ORDER — SILDENAFIL CITRATE 100 MG PO TABS: 50.0000 mg | ORAL_TABLET | Freq: Every day | ORAL | 11 refills | Status: DC | PRN

## 2017-12-07 NOTE — Progress Notes (Signed)
Trevor Guerrero is a 57 y.o. male who presents to [name]: [facility] Care [facility] Medicine today for well adult visit.  Trevor Guerrero is doing reasonably well.  He notes that he has had some weight regain over the last year or so.  He is still exercising by walking regularly but notes that his diet has worsened a bit.  He feels well and notes that he forgot to take his blood pressure medicine this morning but usually takes a daily.  He notes his blood pressures typically well controlled.  He also notes that his sister was recently diagnosed with alpha-1 antitrypsin deficiency and he has been told that he should have this tested.  He has a personal medical history of solitary lung nodule that is been followed by serial lung CTs.  His last CT scan was June 2019.   ROS as above:  Past Medical History:  Diagnosis Date  . Erectile dysfunction 03/28/2014  . Essential hypertension 02/28/2015  . Family history of alpha 1 antitrypsin deficiency 12/07/2017  . First degree AV block 05/18/2015   Normal 48 Hour Holter Monitor May 2017   . Hyperlipidemia 11/16/2013   AHA 10 year risk of 9.0% Nov 2016   . Solitary pulmonary nodule 11/24/2013   In addition, there is a non calcified right middle lobe nodule measuring 4-5 mm. This may reflect a noncalcified granuloma. No follow-up needed if patient is low-risk. Non-contrast chest CT can be considered in 12 months if patient is high-risk  Plan to repeat CT in 1 year   Past Surgical History:  Procedure Laterality Date  . APPENDECTOMY    . left testicular mass Left    Social History   Tobacco Use  . Smoking status: Former Smoker    Last attempt to quit: 11/16/1979    Years since quitting: 38.0  . Smokeless tobacco: Never Used  Substance Use Topics  . Alcohol use: No    Alcohol/week: 0.0 standard drinks   family history includes Alpha-1 antitrypsin deficiency in his  sister.  Medications: Current Outpatient Medications  Medication Sig Dispense Refill  . aspirin 81 MG tablet Take 81 mg by mouth daily.    Marland Kitchen atorvastatin (LIPITOR) 20 MG tablet TAKE 1 TABLET BY MOUTH EVERY DAY 150 tablet 0  . diclofenac sodium (VOLTAREN) 1 % GEL Apply 4 g topically 4 (four) times daily. To affected joint. 100 g 11  . lisinopril (PRINIVIL,ZESTRIL) 10 MG tablet Take 1 tablet (10 mg total) by mouth daily. 90 tablet 3  . omeprazole (PRILOSEC) 40 MG capsule Take 1 capsule (40 mg total) by mouth 2 (two) times daily. 90 capsule 3  . ranitidine (ZANTAC) 150 MG tablet TAKE 1 TABLET(150 MG) BY MOUTH AT BEDTIME    . sildenafil (REVATIO) 20 MG tablet TAKE 2 TO 4 TABLETS BY MOUTH 30 MINUTES PRIOR TO SEX ON AN AS NEEDED BASIS. 50 tablet 12  . vitamin B-12 (CYANOCOBALAMIN) 1000 MCG tablet Take 1,000 mcg by mouth daily.    . sildenafil (VIAGRA) 100 MG tablet Take 0.5-1 tablets (50-100 mg total) by mouth daily as needed for erectile dysfunction. 30 tablet 11   No current facility-administered medications for this visit.    No Known Allergies  Health Maintenance Health Maintenance  Topic Date Due  . COLONOSCOPY  01/03/2024  . TETANUS/TDAP  12/03/2026  . INFLUENZA VACCINE  Completed  . Hepatitis C Screening  Completed  . HIV Screening  Completed     Exam:  BP (!) 146/69   Pulse (!) 59   Ht 5\' 9"  (1.753 m)   Wt 252 lb (114.3 kg)   BMI 37.21 kg/m  Wt Readings from Last 5 Encounters:  12/07/17 252 lb (114.3 kg)  06/09/17 243 lb (110.2 kg)  12/02/16 242 lb (109.8 kg)  09/02/16 238 lb (108 kg)  08/11/16 248 lb (112.5 kg)      Gen: Well NAD HEENT: EOMI,  MMM Lungs: Normal work of breathing. CTABL Heart: RRR no MRG Abd: NABS, Soft. Nondistended, Nontender Exts: Brisk capillary refill, warm and well perfused.  Psych: Alert and oriented normal speech thought process and affect.  Depression screen [facility] 2/9 12/07/2017 12/07/2017 06/09/2017 09/02/2016 08/11/2016  Decreased Interest  0 0 0 0 0  Down, Depressed, Hopeless 0 0 0 0 0  PHQ - 2 Score 0 0 0 0 0  Altered sleeping 0 - 0 - -  Tired, decreased energy 0 - 0 - -  Change in appetite 0 - 0 - -  Feeling bad or failure about yourself  0 - 0 - -  Trouble concentrating 0 - 0 - -  Moving slowly or fidgety/restless 0 - 0 - -  Suicidal thoughts 0 - 0 - -  PHQ-9 Score 0 - 0 - -  Difficult doing work/chores Not difficult at all - Not difficult at all - -       Lab and Radiology Results EXAM: CT CHEST WITHOUT CONTRAST  TECHNIQUE: Multidetector CT imaging of the chest was performed following the standard protocol without IV contrast.  COMPARISON:  06/09/2017  FINDINGS: Cardiovascular: Heart is normal size. Aorta is normal caliber.  Mediastinum/Nodes: Calcified subcarinal mediastinal lymph nodes. No mediastinal, hilar, or axillary adenopathy.  Lungs/Pleura: Calcified granuloma in the right middle lobe corresponds to the density seen on chest x-ray. There is a small subpleural calcified granuloma in the posterior right lower lobe. Small noncalcified nodule in the right middle lobe peripherally on image 111 measuring 4-5 mm. No effusions.  Upper Abdomen: Imaging into the upper abdomen shows no acute findings.  Musculoskeletal: No acute bony abnormality.  IMPRESSION: Evidence of old granulomatous disease, corresponding to the density seen on prior chest x-ray. In addition, there is a non calcified right middle lobe nodule measuring 4-5 mm. This may reflect a noncalcified granuloma. No follow-up needed if patient is low-risk. Non-contrast chest CT can be considered in 12 months if patient is high-risk. This recommendation follows the consensus statement: Guidelines for Management of Incidental Pulmonary Nodules Detected on CT Images: From the Fleischner Society 2017; Radiology 2017; 284:228-243.   Electronically Signed   By: Charlett Nose M.D.   On: 06/17/2017 10:04    Assessment and  Plan: 57 y.o. male with  Well adult.  Doing reasonably well.  Plan to check basic fasting labs listed below.  We will also check alpha-1 antitrypsin level to screen for antitrypsin deficiency based on family history.  Additionally we will recheck PSA for prostate cancer screening and check basic fasting labs below to follow-up hypertension and hyperlipidemia.  Medications refilled.  Counseling for obesity including exercise and diet provided.  Recheck sooner if needed otherwise follow-up in 1 year.  If alpha-1 antitrypsin normal patient is low risk for lung cancer and will not need follow-up CT scan likely.   Orders Placed This Encounter  Procedures  . Alpha-1-antitrypsin  . CBC  . COMPLETE METABOLIC PANEL WITH GFR  . Lipid Panel w/reflex Direct LDL  . PSA   Meds ordered  this encounter  Medications  . lisinopril (PRINIVIL,ZESTRIL) 10 MG tablet    Sig: Take 1 tablet (10 mg total) by mouth daily.    Dispense:  90 tablet    Refill:  3  . omeprazole (PRILOSEC) 40 MG capsule    Sig: Take 1 capsule (40 mg total) by mouth 2 (two) times daily.    Dispense:  90 capsule    Refill:  3  . sildenafil (VIAGRA) 100 MG tablet    Sig: Take 0.5-1 tablets (50-100 mg total) by mouth daily as needed for erectile dysfunction.    Dispense:  30 tablet    Refill:  11     Discussed warning signs or symptoms. Please see discharge instructions. Patient expresses understanding.

## 2017-12-07 NOTE — Patient Instructions (Signed)
Thank you for coming in today. Get labs today.  Recheck yearly if all is well.  Keep track of blood pressure at home and let me know.  Work on reduced carbs and calories to lose that weight weight again.  Try to get 1800 calories a day or less.   Alpha-1 Antitrypsin Deficiency Alpha-1 antitrypsin (AAT) is a protein that helps the lungs and other organs to work properly. It is made by the liver. AAT deficiency is a genetic condition that happens when the liver produces too little AAT, no AAT, or AAT that does not work properly. AAT deficiency can cause liver disease, lung disease, and a skin condition that is called panniculitis (rare). Exposure to things like smoke and dust can make AAT deficiency worse. What are the causes? AAT deficiency is caused by a genetic defect (gene mutation). The gene mutation is passed from parent to child (inherited). The disease typically develops only if a person inherits the defective gene from both parents. What increases the risk? You may be at greater risk for AAT if you:  Have a family history of the condition.  Are Caucasian and have European ancestry.  What are the signs or symptoms?  Shortness of breath.  Wheezing.  Asthma that is difficult to control.  Coughing.  Recurring infections in the breathing (respiratory) system.  Unexplained weight loss.  Rapid heartbeat.  Fatigue.  Pain in the belly (abdomen).  A yellowish discoloration of the skin, the whites of the eyes, and the mucous membranes (jaundice).  Swelling of the ankles or abdomen.  Hardened skin with painful lumps(panniculitis). How is this diagnosed? Your health care provider can diagnose AAT deficiency with a blood test or with a DNA sample from your mouth. He or she may order additional tests, such as:  Imaging studies of your chest, such as an X-ray or CT scan.  Lung function tests to see how well your lungs are working.  Liver function tests to see how well your  liver is working.  A liver biopsy to check for damage to your liver.  How is this treated? There is no cure for AAT deficiency. However, treatments can relieve symptoms and improve quality of life. Treatment options include:  Inhaled steroids or bronchodilators. These can help with breathing problems.  Pulmonary rehabilitation. This helps to improve lung function and teaches you to breathe more efficiently.  AAT augmentation therapy. This involves weekly injections to increase your level of AAT.  Prescription vitamins, including vitamins D, E, and K. Vitamins can help to maintain normal liver function.  Medicine to relieve symptoms that are related to liver problems, including jaundice, fluid retention, and severe itching.  A lung transplant. This is rarely needed. It may help someone who has severe AAT deficiency.  Follow these instructions at home: Lifestyle  Do not use any tobacco products, including cigarettes, chewing tobacco, or electronic cigarettes. If you need help quitting, ask your health care provider.  Avoid secondhand smoke.  Limit alcohol intake to no more than 1 drink per day for nonpregnant women and 2 drinks per day for men. One drink equals 12 ounces of beer, 5 ounces of wine, or 1 ounces of hard liquor.  Exercise regularly. Reducing Breathing Problems  Stay indoors when air quality is poor.  Wear a mask to keep your airways free of dust.  Talk to your health care provider about getting flu (influenza) and pneumonia vaccines to prevent respiratory infections.  Wash your hands often to prevent infections.  Avoid activities such as vacuuming or mowing when possible. General instructions  Take medicines and vitamins only as directed by your health care provider.  Keep all follow-up visits as directed by your health care provider. This is important. Contact a health care provider if:  You often get respiratory infections, such as bronchitis or  pneumonia.  You wheeze, cough, or have other breathing problems that are not helped with medicine.  You experience new liver-related problems, such as jaundice. Get help right away if:  Your respiratory infection does not go away completely, or it returns after treatment.  Your breathing problems are so bad that you become dizzy or weak or you pass out. This information is not intended to replace advice given to you by your health care provider. Make sure you discuss any questions you have with your health care provider. Document Released: 11/24/2003 Document Revised: 05/31/2015 Document Reviewed: 09/05/2013 Elsevier Interactive Patient Education  2018 [employer].

## 2017-12-08 LAB — LIPID PANEL W/REFLEX DIRECT LDL
CHOL/HDL RATIO: 3.4 (calc) (ref <5.0)
CHOLESTEROL: 137 mg/dL (ref <200)
HDL: 40 mg/dL — ABNORMAL LOW (ref >40)
LDL CHOLESTEROL (CALC): 77 mg/dL
Non-HDL Cholesterol (Calc): 97 mg/dL (calc) (ref <130)
Triglycerides: 110 mg/dL (ref <150)

## 2017-12-08 LAB — COMPLETE METABOLIC PANEL WITH GFR
AG Ratio: 2.2 (calc) (ref 1.0–2.5)
ALKALINE PHOSPHATASE (APISO): 71 U/L (ref 40–115)
ALT: 27 U/L (ref 9–46)
AST: 17 U/L (ref 10–35)
Albumin: 4.4 g/dL (ref 3.6–5.1)
BUN: 17 mg/dL (ref 7–25)
CO2: 25 mmol/L (ref 20–32)
CREATININE: 1.01 mg/dL (ref 0.70–1.33)
Calcium: 9.7 mg/dL (ref 8.6–10.3)
Chloride: 108 mmol/L (ref 98–110)
GFR, Est African American: 95 mL/min/{1.73_m2} (ref >=60)
GFR, Est Non African American: 82 mL/min/{1.73_m2} (ref >=60)
GLUCOSE: 117 mg/dL — AB (ref 65–99)
Globulin: 2 g/dL (calc) (ref 1.9–3.7)
Potassium: 4.4 mmol/L (ref 3.5–5.3)
Sodium: 141 mmol/L (ref 135–146)
Total Bilirubin: 0.3 mg/dL (ref 0.2–1.2)
Total Protein: 6.4 g/dL (ref 6.1–8.1)

## 2017-12-08 LAB — CBC
HCT: 40.6 % (ref 38.5–50.0)
Hemoglobin: 13.7 g/dL (ref 13.2–17.1)
MCH: 29.3 pg (ref 27.0–33.0)
MCHC: 33.7 g/dL (ref 32.0–36.0)
MCV: 86.8 fL (ref 80.0–100.0)
MPV: 9.9 fL (ref 7.5–12.5)
PLATELETS: 258 10*3/uL (ref 140–400)
RBC: 4.68 10*6/uL (ref 4.20–5.80)
RDW: 13 % (ref 11.0–15.0)
WBC: 6 10*3/uL (ref 3.8–10.8)

## 2017-12-08 LAB — ALPHA-1-ANTITRYPSIN: A-1 Antitrypsin, Ser: 75 mg/dL — ABNORMAL LOW (ref 83–199)

## 2017-12-08 LAB — PSA: PSA: 0.4 ng/mL (ref <=4.0)

## 2017-12-09 NOTE — Addendum Note (Signed)
Addended by: Rodolph Bong on: 12/09/2017 07:07 AM   Modules accepted: Orders

## 2017-12-15 LAB — ALPHA-1 ANTITRYPSIN PHENOTYPE: A-1 Antitrypsin, Ser: 74 mg/dL — ABNORMAL LOW (ref 83–199)

## 2017-12-17 ENCOUNTER — Encounter: Payer: Self-pay | Admitting: Family Medicine

## 2017-12-17 ENCOUNTER — Telehealth: Payer: Self-pay | Admitting: Family Medicine

## 2017-12-17 DIAGNOSIS — E8801 Alpha-1-antitrypsin deficiency: Secondary | ICD-10-CM

## 2017-12-17 HISTORY — DX: Alpha-1-antitrypsin deficiency: E88.01

## 2017-12-17 NOTE — Telephone Encounter (Signed)
Patient calling in stating that he needs a lung test done per las visit. Did not see anything in the notes. Please advise.

## 2017-12-17 NOTE — Telephone Encounter (Signed)
Contacted patient and made an appointment.

## 2017-12-17 NOTE — Telephone Encounter (Signed)
Please call patient and schedule him for Spirometry test. Please see Willaim Rayas on how to schedule. Rhonda Cunningham,CMA

## 2017-12-24 ENCOUNTER — Ambulatory Visit (INDEPENDENT_AMBULATORY_CARE_PROVIDER_SITE_OTHER): Payer: PRIVATE HEALTH INSURANCE | Admitting: Family Medicine

## 2017-12-24 VITALS — BP 126/69 | HR 55 | Ht 69.0 in | Wt 247.0 lb

## 2017-12-24 DIAGNOSIS — Z8349 Family history of other endocrine, nutritional and metabolic diseases: Secondary | ICD-10-CM

## 2017-12-24 DIAGNOSIS — E8801 Alpha-1-antitrypsin deficiency: Secondary | ICD-10-CM

## 2017-12-24 DIAGNOSIS — R05 Cough: Secondary | ICD-10-CM | POA: Diagnosis not present

## 2017-12-24 DIAGNOSIS — R942 Abnormal results of pulmonary function studies: Secondary | ICD-10-CM | POA: Diagnosis not present

## 2017-12-24 DIAGNOSIS — R059 Cough, unspecified: Secondary | ICD-10-CM

## 2017-12-24 MED ORDER — ALBUTEROL SULFATE (2.5 MG/3ML) 0.083% IN NEBU
2.5000 mg | INHALATION_SOLUTION | Freq: Once | RESPIRATORY_TRACT | Status: AC
  Administered 2017-12-24: 2.5 mg via RESPIRATORY_TRACT

## 2017-12-24 NOTE — Addendum Note (Signed)
Addended by: Rodolph Bong on: 12/24/2017 12:56 PM   Modules accepted: Orders

## 2017-12-24 NOTE — Progress Notes (Signed)
Trevor Guerrero is a 57 y.o. male who presents to [name]: [facility] Medicine today for follow-up and to 1 alpha trypsin deficiency and spirometry.  As noted during his well exam Gagan is a family history he just learned about of alpha-1 antitrypsin deficiency.  He had testing that showed partial inactivation of alpha-1 antitrypsin with decreased levels moderately.  He is asymptomatic without a lot of shortness of breath cough or wheezing.  Fortunately he has not smoked in many years.  He had spirometry testing performed today.  He is here to discuss results.   ROS as above:  Exam:  BP 126/69   Pulse (!) 55   Ht 5\' 9"  (1.753 m)   Wt 247 lb (112 kg)   SpO2 97%   BMI 36.48 kg/m  Wt Readings from Last 5 Encounters:  12/24/17 247 lb (112 kg)  12/07/17 252 lb (114.3 kg)  06/09/17 243 lb (110.2 kg)  12/02/16 242 lb (109.8 kg)  09/02/16 238 lb (108 kg)    Gen: Well NAD HEENT: EOMI,  MMM Lungs: Normal work of breathing. CTABL Heart: RRR no MRG Abd: NABS, Soft. Nondistended, Nontender Exts: Brisk capillary refill, warm and well perfused.   Lab and Radiology Results Pulmonary function testing shows decreased FVC at 80% of predicted and decreased FEV1 at 77% predicted.  FEV1/FVC percentages normal as are PEF. He did not have significant change in FEV1 following bronchodilator treatment.      Alpha-1 antitrypsin phenotype  Order: [id number] - Reflex for Order [id number]  Status:  Final result  Visible to patient:  No (Not Released)  Next appt:  None  Dx:  Alpha-1-antitrypsin deficiency (HCC)   Ref Range & Units 2wk ago (12/10/17) 2wk ago (12/07/17)  A-1 Antitrypsin, Ser 83 - 199 mg/dL 78GNF   [id number]    [id number] (AAT) PHENOTYPE  SEE NOTE    Comment: .  The banding pattern most closely matches our PI*MM  prototype. The bands appear to have been altered by disease,    drugs, or possibly in vitro.  .  90% of normal individuals have the MM phenotype, with normal  quantitative AAT levels. Many phenotypic patterns have been  described, including deficiency states with F, S, Z, or  other alleles. As a general estimation, compared to M allele  of 100% of normal A-1-Antitrypsin protein, the S allele  produces approximately 60% and the Z allele 20%. For  example, an MS phenotype would have about 80% of normal  A-1-Antitrypsin protein level, a 50% contribution from the M  allele and 30% from the S allele. A ZZ phenotype would have  about 20% of normal levels, a 10% contribution from each Z  gene. The F allele has normal A-1-Antitrypsin levels, but  the kinetics of elastase inhibition is not as efficient as  an M allele product; F alleles should be considered  functionally mildly deficient. Other variants are  identifiable by phenotypic analysis. These include CM, DP,  EM, GM, IS, LM, M1M2, M3M3, MP, MT, XX, MY, and M1N. I, P, T  and null alleles are considered deleterious. C, D, E, G, L,  M1, M2, M3, X and Y alleles are generally considered normal  variants. The MZ-Pratt phenotype is a normal variant; care  should be taken to avoid confusion with the deficient MZ phenotype.          Assessment and Plan: 57 y.o. male with mild alpha-1 antitrypsin deficiency.  It  appears patient has a phenotype that is moderate functionality.  Fortunately he is not very symptomatic and has mild changes seen on spirometry today.  I think it is probably a good idea to discuss with pulmonology his findings and to be sure that there is no other treatment needed.  If he starts having shortness of breath is a very reasonable to use LAMA therapy.   I spent 25 minutes with this patient, greater than 50% was face-to-face time counseling regarding ddx and plan.    Orders Placed This Encounter  Procedures  . PR EVAL OF BRONCHOSPASM   Meds ordered this encounter  Medications   . albuterol (PROVENTIL) (2.5 MG/3ML) 0.083% nebulizer solution 2.5 mg     Historical information moved to improve visibility of documentation.  Past Medical History:  Diagnosis Date  . Erectile dysfunction 03/28/2014  . Essential hypertension 02/28/2015  . Family history of alpha 1 antitrypsin deficiency 12/07/2017  . First degree AV block 05/18/2015   Normal 48 Hour Holter Monitor May 2017   . 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  Less than 57 is considered severe  . Hyperlipidemia 11/16/2013   AHA 10 year risk of 9.0% Nov 2016   . Solitary pulmonary nodule 11/24/2013   In addition, there is a non calcified right middle lobe nodule measuring 4-5 mm. This may reflect a noncalcified granuloma. No follow-up needed if patient is low-risk. Non-contrast chest CT can be considered in 12 months if patient is high-risk  Plan to repeat CT in 1 year   Past Surgical History:  Procedure Laterality Date  . APPENDECTOMY    . left testicular mass Left    Social History   Tobacco Use  . Smoking status: Former Smoker    Last attempt to quit: 11/16/1979    Years since quitting: 38.1  . Smokeless tobacco: Never Used  Substance Use Topics  . Alcohol use: No    Alcohol/week: 0.0 standard drinks   family history includes Alpha-1 antitrypsin deficiency in his sister.  Medications: Current Outpatient Medications  Medication Sig Dispense Refill  . aspirin 81 MG tablet Take 81 mg by mouth daily.    Marland Kitchen. atorvastatin (LIPITOR) 20 MG tablet TAKE 1 TABLET BY MOUTH EVERY DAY 150 tablet 0  . diclofenac sodium (VOLTAREN) 1 % GEL Apply 4 g topically 4 (four) times daily. To affected joint. 100 g 11  . lisinopril (PRINIVIL,ZESTRIL) 10 MG tablet Take 1 tablet (10 mg total) by mouth daily. 90 tablet 3  . omeprazole (PRILOSEC) 40 MG capsule Take 1 capsule (40 mg total) by mouth 2 (two) times daily. 90 capsule 3  . ranitidine (ZANTAC) 150 MG tablet TAKE 1 TABLET(150 MG) BY  MOUTH AT BEDTIME    . sildenafil (REVATIO) 20 MG tablet TAKE 2 TO 4 TABLETS BY MOUTH 30 MINUTES PRIOR TO SEX ON AN AS NEEDED BASIS. 50 tablet 12  . sildenafil (VIAGRA) 100 MG tablet Take 0.5-1 tablets (50-100 mg total) by mouth daily as needed for erectile dysfunction. 30 tablet 11  . vitamin B-12 (CYANOCOBALAMIN) 1000 MCG tablet Take 1,000 mcg by mouth daily.     No current facility-administered medications for this visit.    No Known Allergies   Discussed warning signs or symptoms. Please see discharge instructions. Patient expresses understanding.

## 2017-12-24 NOTE — Patient Instructions (Signed)
I think you may have the early findings of COPD but I would like you to have a visit with a lung doctor.  You should hear from pulmonologist.  Recheck with me a scheduled and needed.

## 2017-12-31 ENCOUNTER — Other Ambulatory Visit: Payer: Self-pay | Admitting: Family Medicine

## 2018-01-28 ENCOUNTER — Ambulatory Visit (HOSPITAL_BASED_OUTPATIENT_CLINIC_OR_DEPARTMENT_OTHER)
Admission: RE | Admit: 2018-01-28 | Discharge: 2018-01-28 | Disposition: A | Discharge status: Home / Self Care | Payer: PRIVATE HEALTH INSURANCE | Source: Ambulatory Visit | Attending: Pulmonary Disease | Admitting: Pulmonary Disease

## 2018-01-28 ENCOUNTER — Encounter: Payer: Self-pay | Admitting: Pulmonary Disease

## 2018-01-28 ENCOUNTER — Ambulatory Visit (INDEPENDENT_AMBULATORY_CARE_PROVIDER_SITE_OTHER): Payer: PRIVATE HEALTH INSURANCE | Admitting: Pulmonary Disease

## 2018-01-28 VITALS — BP 142/74 | HR 66 | Ht 69.0 in | Wt 244.0 lb

## 2018-01-28 DIAGNOSIS — E8801 Alpha-1-antitrypsin deficiency: Secondary | ICD-10-CM | POA: Insufficient documentation

## 2018-01-28 NOTE — Progress Notes (Signed)
Synopsis: Referred in 01/2018 for alpha-1-antitrypsin def  Subjective:   PATIENT ID: Trevor Guerrero GENDER: male DOB: Aug 04, 1960, MRN: [id number]   HPI  Chief Complaint  Patient presents with  . Consult    Referred by Dr. Denyse Amassorey for Alpha-1.  Pt's sister recently dx'ed, had labs drawn through PCP but was referred here for second opinion.     This is a pleasant 58 year old male who comes to my clinic today for evaluation of alpha-1 antitrypsin deficiency.  He says that he does not have any problems with shortness of breath.  He will occasionally have a cough while driving.  He also has a history of gastroesophageal reflux disease.  He recently found out that his sister has alpha-1 antitrypsin deficiency so she recommended that all family members to be screened.  He had a genotype and total level collected by his primary care physician which shows deficiency of the total enzyme concentration and the genotype is documented as "most closely resembles M*M".    He says that his mother died of unexplained cirrhosis, never drank alcohol.  His sister had a persistent cough and was found to be "a carrier and is now on treatment".  He denies problem with exertional shortness of breath or mucus production.  Past Medical History:  Diagnosis Date  . Erectile dysfunction 03/28/2014  . Essential hypertension 02/28/2015  . Family history of alpha 1 antitrypsin deficiency 12/07/2017  . First degree AV block 05/18/2015   Normal 48 Hour Holter Monitor May 2017   . 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  Less than 57 is considered severe  . Hyperlipidemia 11/16/2013   AHA 10 year risk of 9.0% Nov 2016   . Solitary pulmonary nodule 11/24/2013   In addition, there is a non calcified right middle lobe nodule measuring 4-5 mm. This may reflect a noncalcified granuloma. No follow-up needed if patient is low-risk. Non-contrast chest CT can be considered in 12 months if patient is  high-risk  Plan to repeat CT in 1 year     Family History  Problem Relation Age of Onset  . Alpha-1 antitrypsin deficiency Sister   . Asthma Neg Hx   . Heart failure Neg Hx   . Hypertension Neg Hx      Social History   Socioeconomic History  . Marital status: Married    Spouse name: Not on file  . Number of children: Not on file  . Years of education: Not on file  . Highest education level: Not on file  Occupational History  . Not on file  Social Needs  . Financial resource strain: Not on file  . Food insecurity:    Worry: Not on file    Inability: Not on file  . Transportation needs:    Medical: Not on file    Non-medical: Not on file  Tobacco Use  . Smoking status: Former Smoker    Packs/day: 0.25    Years: 2.00    Pack years: 0.50    Types: Cigarettes    Last attempt to quit: 11/16/1979    Years since quitting: 38.2  . Smokeless tobacco: Never Used  Substance and Sexual Activity  . Alcohol use: No    Alcohol/week: 0.0 standard drinks  . Drug use: No  . Sexual activity: Yes    Partners: Female  Lifestyle  . Physical activity:    Days per week: Not on file    Minutes per session: Not on file  .  Stress: Not on file  Relationships  . Social connections:    Talks on phone: Not on file    Gets together: Not on file    Attends religious service: Not on file    Active member of club or organization: Not on file    Attends meetings of clubs or organizations: Not on file    Relationship status: Not on file  . Intimate partner violence:    Fear of current or ex partner: Not on file    Emotionally abused: Not on file    Physically abused: Not on file    Forced sexual activity: Not on file  Other Topics Concern  . Not on file  Social History Narrative  . Not on file     No Known Allergies   Outpatient Medications Prior to Visit  Medication Sig Dispense Refill  . aspirin 81 MG tablet Take 81 mg by mouth daily.    Marland Kitchen atorvastatin (LIPITOR) 20 MG tablet TAKE  1 TABLET BY MOUTH EVERY DAY 150 tablet 0  . diclofenac sodium (VOLTAREN) 1 % GEL Apply 4 g topically 4 (four) times daily. To affected joint. 100 g 11  . lisinopril (PRINIVIL,ZESTRIL) 10 MG tablet TAKE 1 TABLET BY MOUTH EVERY DAY 90 tablet 1  . omeprazole (PRILOSEC) 40 MG capsule Take 1 capsule (40 mg total) by mouth 2 (two) times daily. 90 capsule 3  . sildenafil (REVATIO) 20 MG tablet TAKE 2 TO 4 TABLETS BY MOUTH 30 MINUTES PRIOR TO SEX ON AN AS NEEDED BASIS. 50 tablet 12  . vitamin B-12 (CYANOCOBALAMIN) 1000 MCG tablet Take 1,000 mcg by mouth daily.    . ranitidine (ZANTAC) 150 MG tablet TAKE 1 TABLET(150 MG) BY MOUTH AT BEDTIME    . sildenafil (VIAGRA) 100 MG tablet Take 0.5-1 tablets (50-100 mg total) by mouth daily as needed for erectile dysfunction. 30 tablet 11   No facility-administered medications prior to visit.     Review of Systems  Constitutional: Negative for fever and weight loss.  HENT: Negative for congestion, ear pain, nosebleeds and sore throat.   Eyes: Negative for redness.  Respiratory: Negative for cough, shortness of breath and wheezing.   Cardiovascular: Negative for palpitations, leg swelling and PND.  Gastrointestinal: Negative for nausea and vomiting.  Genitourinary: Negative for dysuria.  Skin: Negative for rash.  Neurological: Negative for headaches.  Endo/Heme/Allergies: Does not bruise/bleed easily.  Psychiatric/Behavioral: Negative for depression. The patient is not nervous/anxious.       Objective:  Physical Exam   Vitals:   01/28/18 1434  BP: (!) 142/74  Pulse: 66  SpO2: 97%  Weight: 244 lb (110.7 kg)  Height: 5\' 9"  (1.753 m)    Gen: well appearing, no acute distress HENT: NCAT, OP clear, neck supple without masses Eyes: PERRL, EOMi Lymph: no cervical lymphadenopathy PULM: CTA B CV: RRR, no mgr, no JVD GI: BS+, soft, nontender, no hsm Derm: no rash or skin breakdown MSK: normal bulk and tone Neuro: A&Ox4, CN II-XII intact, strength  5/5 in all 4 extremities Psyche: normal mood and affect    CBC    Component Value Date/Time   WBC 6.0 12/07/2017 0826   RBC 4.68 12/07/2017 0826   HGB 13.7 12/07/2017 0826   HCT 40.6 12/07/2017 0826   PLT 258 12/07/2017 0826   MCV 86.8 12/07/2017 0826   MCH 29.3 12/07/2017 0826   MCHC 33.7 12/07/2017 0826   RDW 13.0 12/07/2017 0826     Chest imaging:  PFT:  December 2019 spirometry performed primary care doctor's office reviewed: Normal, no airflow obstruction  Labs: December 2019 alpha-1 antitrypsin level 75 (low, genotype interpreted as "most closely matches M*M")  Path:  Echo:  Heart Catheterization:       Assessment & Plan:   Alpha-1-antitrypsin deficiency (HCC) - Plan: US ABDOMEN LIMITED RUQ, CANCELED: US ABDOMEN LIMITED RUQ  Discussion: Mr. Felicity PellegriniMurdock has low total alpha-1 antitrypsin levels, and an unusual interpretation of his alpha-1 phenotype reading "most closely matches M*M".  I have never actually seen this interpretation before and suspect that he may have an alpha-1 antitrypsin polymorphism which is not well described without higher level sequencing.  I think gene sequencing could give us an answer but as he is asymptomatic and has no airflow obstruction I do not think it would be worth the effort or cost for that.  Because his mother had unexplained cirrhosis it is probably worthwhile for him to have a right upper quadrant ultrasound to make sure there is no evidence of cirrhosis.  So while his labs show alpha-1 antitrypsin deficiency I do not think that he is going to go on to develop any sort of lung disease.  Plan: Alpha-1 antitrypsin deficiency: We will check a right upper quadrant ultrasound to make sure there is no evidence of cirrhosis Fortunately you have no evidence of lung disease based on the test performed by Dr. Denyse Amassorey While your total alpha-1 antitrypsin serum concentration is slightly low, the lab says that it appears that the protein is  normal.  I do not think that this is going to cause a problem for you.  However, if you developed unexplained shortness of breath or if there is evidence of a liver problem on the ultrasound we obtain then we may need to refer you to an alpha-1 antitrypsin specialty center to evaluate this further. At this time though, I think there is nothing for you to be concerned about.  Follow up as needed.    Current Outpatient Medications:  .  aspirin 81 MG tablet, Take 81 mg by mouth daily., Disp: , Rfl:  .  atorvastatin (LIPITOR) 20 MG tablet, TAKE 1 TABLET BY MOUTH EVERY DAY, Disp: 150 tablet, Rfl: 0 .  diclofenac sodium (VOLTAREN) 1 % GEL, Apply 4 g topically 4 (four) times daily. To affected joint., Disp: 100 g, Rfl: 11 .  lisinopril (PRINIVIL,ZESTRIL) 10 MG tablet, TAKE 1 TABLET BY MOUTH EVERY DAY, Disp: 90 tablet, Rfl: 1 .  omeprazole (PRILOSEC) 40 MG capsule, Take 1 capsule (40 mg total) by mouth 2 (two) times daily., Disp: 90 capsule, Rfl: 3 .  sildenafil (REVATIO) 20 MG tablet, TAKE 2 TO 4 TABLETS BY MOUTH 30 MINUTES PRIOR TO SEX ON AN AS NEEDED BASIS., Disp: 50 tablet, Rfl: 12 .  vitamin B-12 (CYANOCOBALAMIN) 1000 MCG tablet, Take 1,000 mcg by mouth daily., Disp: , Rfl:

## 2018-01-28 NOTE — Patient Instructions (Signed)
Alpha-1 antitrypsin deficiency: We will check a right upper quadrant ultrasound to make sure there is no evidence of cirrhosis Fortunately you have no evidence of lung disease based on the test performed by Dr. Denyse Amass While your total alpha-1 antitrypsin serum concentration is slightly low, the lab says that it appears that the protein is normal.  I do not think that this is going to cause a problem for you.  However, if you developed unexplained shortness of breath or if there is evidence of a liver problem on the ultrasound we obtain then we may need to refer you to an alpha-1 antitrypsin specialty center to evaluate this further. At this time though, I think there is nothing for you to be concerned about.  Follow up as needed.

## 2018-05-30 ENCOUNTER — Other Ambulatory Visit: Payer: Self-pay | Admitting: Family Medicine

## 2018-06-14 ENCOUNTER — Telehealth: Payer: Self-pay | Admitting: Family Medicine

## 2018-06-14 DIAGNOSIS — R911 Solitary pulmonary nodule: Secondary | ICD-10-CM

## 2018-06-14 NOTE — Telephone Encounter (Signed)
Tried to call patient to make him aware. Phone hang, then hung up. No opportunity to leave vm. Will try again later.

## 2018-06-14 NOTE — Telephone Encounter (Signed)
Previous small lung nodules seen.  Patient due for 65-month follow-up now.  Plan to proceed with repeat CT scan.  Scan should be done in [place].  He should be contacted soon about scheduling.

## 2018-06-14 NOTE — Telephone Encounter (Signed)
-----   Message from Gregor Hams, MD sent at 06/17/2017  1:04 PM EDT ----- Regarding: Repeat CT chest In addition, there is a non calcified right middle lobe nodule measuring 4-5 mm. This may reflect a noncalcified granuloma. No follow-up needed if patient is low-risk. Non-contrast chest CT can be considered in 12 months if patient is high-risk.  PT is high risk due to smoking.

## 2018-06-15 NOTE — Telephone Encounter (Signed)
Spoke with patient, he was made aware he is due for CT chest follow up of lung nodules and [facility] will be calling him to schedule. He is agreeable to scan.

## 2018-07-23 ENCOUNTER — Other Ambulatory Visit: Payer: Self-pay | Admitting: Family Medicine

## 2018-08-04 ENCOUNTER — Ambulatory Visit (INDEPENDENT_AMBULATORY_CARE_PROVIDER_SITE_OTHER): Payer: PRIVATE HEALTH INSURANCE

## 2018-08-04 ENCOUNTER — Other Ambulatory Visit: Payer: Self-pay

## 2018-08-04 DIAGNOSIS — R911 Solitary pulmonary nodule: Secondary | ICD-10-CM

## 2018-08-19 ENCOUNTER — Ambulatory Visit (INDEPENDENT_AMBULATORY_CARE_PROVIDER_SITE_OTHER): Payer: PRIVATE HEALTH INSURANCE | Admitting: Family Medicine

## 2018-08-19 ENCOUNTER — Other Ambulatory Visit: Payer: Self-pay

## 2018-08-19 ENCOUNTER — Encounter: Payer: Self-pay | Admitting: Family Medicine

## 2018-08-19 VITALS — BP 146/68 | HR 56 | Temp 98.1°F | Wt 254.0 lb

## 2018-08-19 DIAGNOSIS — R42 Dizziness and giddiness: Secondary | ICD-10-CM

## 2018-08-19 DIAGNOSIS — I440 Atrioventricular block, first degree: Secondary | ICD-10-CM

## 2018-08-19 DIAGNOSIS — R5383 Other fatigue: Secondary | ICD-10-CM

## 2018-08-19 DIAGNOSIS — I10 Essential (primary) hypertension: Secondary | ICD-10-CM | POA: Diagnosis not present

## 2018-08-19 NOTE — Patient Instructions (Signed)
Thank you for coming in today. Keep track of blood pressure especially when light headed.  Get labs today.  You should hear about ultrasound soon. (ECHO) Let me know if you do not hear anything.  If still bad we may get you a heart doctor.    Dizziness Dizziness is a common problem. It makes you feel unsteady or light-headed. You may feel like you are about to pass out (faint). Dizziness can lead to getting hurt if you stumble or fall. Dizziness can be caused by many things, including:  Medicines.  Not having enough water in your body (dehydration).  Illness. Follow these instructions at home: Eating and drinking   Drink enough fluid to keep your pee (urine) clear or pale yellow. This helps to keep you from getting dehydrated. Try to drink more clear fluids, such as water.  Do not drink alcohol.  Limit how much caffeine you drink or eat, if your doctor tells you to do that.  Limit how much salt (sodium) you drink or eat, if your doctor tells you to do that. Activity   Avoid making quick movements. ? When you stand up from sitting in a chair, steady yourself until you feel okay. ? In the morning, first sit up on the side of the bed. When you feel okay, stand slowly while you hold onto something. Do this until you know that your balance is fine.  If you need to stand in one place for a long time, move your legs often. Tighten and relax the muscles in your legs while you are standing.  Do not drive or use heavy machinery if you feel dizzy.  Avoid bending down if you feel dizzy. Place items in your home so you can reach them easily without leaning over. Lifestyle  Do not use any products that contain nicotine or tobacco, such as cigarettes and e-cigarettes. If you need help quitting, ask your doctor.  Try to lower your stress level. You can do this by using methods such as yoga or meditation. Talk with your doctor if you need help. General instructions  Watch your dizziness  for any changes.  Take over-the-counter and prescription medicines only as told by your doctor. Talk with your doctor if you think that you are dizzy because of a medicine that you are taking.  Tell a friend or a family member that you are feeling dizzy. If he or she notices any changes in your behavior, have this person call your doctor.  Keep all follow-up visits as told by your doctor. This is important. Contact a doctor if:  Your dizziness does not go away.  Your dizziness or light-headedness gets worse.  You feel sick to your stomach (nauseous).  You have trouble hearing.  You have new symptoms.  You are unsteady on your feet.  You feel like the room is spinning. Get help right away if:  You throw up (vomit) or have watery poop (diarrhea), and you cannot eat or drink anything.  You have trouble: ? Talking. ? Walking. ? Swallowing. ? Using your arms, hands, or legs.  You feel generally weak.  You are not thinking clearly, or you have trouble forming sentences. A friend or family member may notice this.  You have: ? Chest pain. ? Pain in your belly (abdomen). ? Shortness of breath. ? Sweating.  Your vision changes.  You are bleeding.  You have a very bad headache.  You have neck pain or a stiff neck.  You have  a fever. These symptoms may be an emergency. Do not wait to see if the symptoms will go away. Get medical help right away. Call your local emergency services (911 in the [place] Do not drive yourself to the hospital. Summary  Dizziness makes you feel unsteady or light-headed. You may feel like you are about to pass out (faint).  Drink enough fluid to keep your pee (urine) clear or pale yellow. Do not drink alcohol.  Avoid making quick movements if you feel dizzy.  Watch your dizziness for any changes. This information is not intended to replace advice given to you by your health care provider. Make sure you discuss any questions you have with your  health care provider. Document Released: 12/12/2010 Document Revised: 12/26/2016 Document Reviewed: 01/10/2016 Elsevier Patient Education  2020 [employer].

## 2018-08-19 NOTE — Progress Notes (Signed)
Trevor Guerrero is a 58 y.o. male who presents to [name]: Primary Care Sports Medicine today for intermittent lightheadedness and to discuss his recent CT scan.  Lightheadedness: Trevor Guerrero notes a 5-week Hx of almost daily episodes of lightheadedness when he rises from sitting or gets out of the truck. Denies syncope or near-syncope, tachycardia, heart palpitations, chest pain, or vertigo. Home BP measurements are in the 130s systolic as reported by the patient. Reports that he usually drinks 4 bottles of water a day, sometimes less. Patient is tired in clinic today since he works as a Naval architect and got back from [place] at 2 AM last night. He usually gets more sleep.  Fatigue: Patient reports that he feels fatigued and diaphoretic after walking short distances. Distance is variable but around 70 feet. Denies syncope or near-syncope, tachycardia, heart palpitations, chest pain, leg pain, or vertigo.  Lacrimation: Patient reports increasing lacrimation and irritation when reading up close. Denies visual changes, blurriness, or eye pain. Wears bifocal lenses. Patient states that recent eye exam was normal.  Chest CT: Patient is following up on CT results from 08/04/2018 looking at nodule in right lung.  ROS as above:  Exam:  BP (!) 146/68    Pulse (!) 56    Temp 98.1 F (36.7 C) (Oral)    Wt 254 lb (115.2 kg)    BMI 37.51 kg/m   Orthostatic vital signs did not show significant increase in heart rate nor decrease in blood pressure Wt Readings from Last 5 Encounters:  08/19/18 254 lb (115.2 kg)  01/28/18 244 lb (110.7 kg)  12/24/17 247 lb (112 kg)  12/07/17 252 lb (114.3 kg)  06/09/17 243 lb (110.2 kg)    Gen: Well NAD HEENT: EOMI,  MMM Lungs: Normal work of breathing. CTABL Heart: RRR no MRG Abd: NABS, Soft. Nondistended, Nontender Exts: Brisk capillary refill, warm and well perfused.   Lab  and Radiology Results Ct Chest Wo Contrast  Result Date: 08/05/2018 CLINICAL DATA:  F/u lung nodules from scan on 06-17-17. Pt having no symptoms. Es-smoker. HTN EXAM: CT CHEST WITHOUT CONTRAST TECHNIQUE: Multidetector CT imaging of the chest was performed following the standard protocol without IV contrast. COMPARISON:  CT chest without contrast 06/17/2017 FINDINGS: Cardiovascular: Heart size is normal. Normal caliber of the aorta and main pulmonary artery. No aortic atherosclerotic calcification. Trace coronary artery calcification. No pericardial effusion. Mediastinum/Nodes: No enlarged mediastinal or axillary lymph nodes. There are calcified subcarinal mediastinal lymph nodes likely sequela of prior granulomatous disease. Thyroid gland, trachea, and esophagus demonstrate no significant findings. Lungs/Pleura: Lungs are clear. Central airways are patent. No pleural effusion or pneumothorax. Small right perifissural calcified granuloma again seen. Stable 5 mm noncalcified nodule seen in the peripheral right middle lobe (image 183/325). No new nodules identified. Upper Abdomen: No acute abnormality. Musculoskeletal: No chest wall mass or suspicious bone lesions identified. IMPRESSION: 1. Stable 5 mm noncalcified nodule in the peripheral right middle lobe. Given stability, no further follow-up is indicated. 2. Calcified mediastinal lymph nodes and right lung granuloma, likely sequela of prior granulomatous disease. 3. Trace coronary artery calcification. Electronically Signed   By: Emmaline Kluver M.D.   On: 08/05/2018 08:39   I personally (independently) visualized and performed the interpretation of the images attached in this note.   Twelve-lead EKG.  Sinus bradycardia at rate 53 bpm.  PR interval 212.  First-degree AV block.  No ST segment elevation or depression.  EKG normal otherwise.  EKG not significantly changed from description of first-degree AV block from Holter monitor [facility] 2017   Stress  test and Holter monitor report from [facility] cardiology 2017 reviewed normal   Assessment and Plan: 58 y.o. male with Hx of 1st degre AV block and HTN is presenting for intermittent lightheadedness and fatigue.  Lightheadedness: Shawnmichael notes a 5-week Hx of almost daily episodes of lightheadedness when he rises from sitting or gets out of the truck. Denies syncope or near-syncope, tachycardia, heart palpitations, chest pain, or vertigo. Home BP measurements are in the 789F systolic as reported by the patient. BP was elevated today and negative for orthostatics. Reports that he usually drinks 4 bottles of water a day, sometimes less. EKG showed long PR interval consistent with 1st degree AV Block, HR of 53, regular rhythm, no ST elevations or depressions. EKG not concerning for active ischemia. Fluid intake, lack of BP lowering meds besides Lisinopril, and elevated BP readings make orthostatic hypotension unlikely. Recent CT scan on 08/04/2018 showed mild calcifications in coronary arteries. As cardiac pathology cannot be ruled out and patient expressed interest in further workup, ordered echocardiogram to assess heart function. Also ordered CMP, CBC, and TSH to assess electrolytes, anemia, and thyroid levels. We may consult with cardiology depending on echo results or if symptoms don't improve. Follow-up if symptoms worsen or develop chest pain or SOB.  Fatigue: Patient reports that he feels fatigued and diaphoretic after walking short distances. Distance is variable but around 70 feet. Denies syncope or near-syncope, tachycardia, heart palpitations, chest pain, leg pain, or vertigo. Work-up is the same as the co-presenting lightheadedness.  Lacrimation: Patient reports increasing lacrimation and irritation when reading up close. Denial of visual changes, blurriness, or eye pain make serious pathology unlikely. Patient states that recent eye exam was normal. Dry eyes seems likely. Follow-up if symptoms  worsen.  Chest CT: Patient is following up on CT results from 08/04/2018 looking at nodule in right lung. Scan showed no change in 5 mm nodule in right lung making a cancerous process unlikely. CT scan also showed mild calcifications in coronary arteries.  PDMP not reviewed this encounter. Orders Placed This Encounter  Procedures   CBC   COMPLETE METABOLIC PANEL WITH GFR   TSH   ECHOCARDIOGRAM COMPLETE    Standing Status:   Future    Standing Expiration Date:   11/19/2019    Order Specific Question:   Where should this test be performed    Answer:   [facility]    Order Specific Question:   Perflutren DEFINITY (image enhancing agent) should be administered unless hypersensitivity or allergy exist    Answer:   Administer Perflutren    Order Specific Question:   Reason for exam-Echo    Answer:   Other-Full Diagnosis List    Order Specific Question:   Full ICD-10/Reason for Exam    Answer:   Fatigue [810175]   No orders of the defined types were placed in this encounter.    Historical information moved to improve visibility of documentation.  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  Less than 57 is considered severe   Hyperlipidemia 11/16/2013   AHA 10 year risk of 9.0% Nov  2016    Solitary pulmonary nodule 11/24/2013   In addition, there is a non calcified right middle lobe nodule measuring 4-5 mm. This may reflect a noncalcified granuloma. No follow-up needed if patient is low-risk. Non-contrast chest CT can be considered in 12 months if patient is high-risk  Plan to repeat CT in 1 year   Past Surgical History:  Procedure Laterality Date   APPENDECTOMY     left testicular mass  Left    Social History   Tobacco Use   Smoking status: Former Smoker    Packs/day: 0.25    Years: 2.00    Pack years: 0.50    Types: Cigarettes    Quit date: 11/16/1979    Years since quitting: 38.7   Smokeless tobacco: Never Used  Substance Use Topics   Alcohol use: No    Alcohol/week: 0.0 standard drinks   family history includes Alpha-1 antitrypsin deficiency in his sister.  Medications: Current Outpatient Medications  Medication Sig Dispense Refill   aspirin 81 MG tablet Take 81 mg by mouth daily.     atorvastatin (LIPITOR) 20 MG tablet TAKE 1 TABLET BY MOUTH EVERY DAY 150 tablet 0   diclofenac sodium (VOLTAREN) 1 % GEL Apply 4 g topically 4 (four) times daily. To affected joint. 100 g 11   lisinopril (PRINIVIL,ZESTRIL) 10 MG tablet TAKE 1 TABLET BY MOUTH EVERY DAY 90 tablet 1   omeprazole (PRILOSEC) 40 MG capsule TAKE 1 CAPSULE BY MOUTH TWICE DAILY 180 capsule 0   sildenafil (REVATIO) 20 MG tablet TAKE 2 TO 4 TABLETS BY MOUTH 30 MINUTES PRIOR TO SEX ON AN AS NEEDED BASIS. 50 tablet 12   vitamin B-12 (CYANOCOBALAMIN) 1000 MCG tablet Take 1,000 mcg by mouth daily.     No current facility-administered medications for this visit.    No Known Allergies   Discussed warning signs or symptoms. Please see discharge instructions. Patient expresses understanding.  I personally was present and performed or re-performed the history, physical exam and medical decision-making activities of this service and have verified that the service and findings are accurately documented in the student's note. ___________________________________________ Clementeen GrahamEvan Mikaia Janvier M.D., ABFM., CAQSM. Primary Care and Sports Medicine Adjunct Instructor of Family Medicine  [employer]

## 2018-08-20 LAB — COMPLETE METABOLIC PANEL WITH GFR
AG Ratio: 2 (calc) (ref 1.0–2.5)
ALT: 26 U/L (ref 9–46)
AST: 21 U/L (ref 10–35)
Albumin: 4.4 g/dL (ref 3.6–5.1)
Alkaline phosphatase (APISO): 73 U/L (ref 35–144)
BUN: 15 mg/dL (ref 7–25)
CO2: 24 mmol/L (ref 20–32)
Calcium: 10.1 mg/dL (ref 8.6–10.3)
Chloride: 105 mmol/L (ref 98–110)
Creat: 1.1 mg/dL (ref 0.70–1.33)
GFR, Est African American: 85 mL/min/{1.73_m2} (ref >=60)
GFR, Est Non African American: 74 mL/min/{1.73_m2} (ref >=60)
Globulin: 2.2 g/dL (calc) (ref 1.9–3.7)
Glucose, Bld: 106 mg/dL — ABNORMAL HIGH (ref 65–99)
Potassium: 4.4 mmol/L (ref 3.5–5.3)
Sodium: 138 mmol/L (ref 135–146)
Total Bilirubin: 0.7 mg/dL (ref 0.2–1.2)
Total Protein: 6.6 g/dL (ref 6.1–8.1)

## 2018-08-20 LAB — CBC
HCT: 41.3 % (ref 38.5–50.0)
Hemoglobin: 14.1 g/dL (ref 13.2–17.1)
MCH: 30 pg (ref 27.0–33.0)
MCHC: 34.1 g/dL (ref 32.0–36.0)
MCV: 87.9 fL (ref 80.0–100.0)
MPV: 9.6 fL (ref 7.5–12.5)
Platelets: 284 10*3/uL (ref 140–400)
RBC: 4.7 10*6/uL (ref 4.20–5.80)
RDW: 12.9 % (ref 11.0–15.0)
WBC: 5.4 10*3/uL (ref 3.8–10.8)

## 2018-08-20 LAB — TSH: TSH: 0.96 mIU/L (ref 0.40–4.50)

## 2018-09-09 ENCOUNTER — Other Ambulatory Visit: Payer: Self-pay

## 2018-09-09 ENCOUNTER — Ambulatory Visit (HOSPITAL_BASED_OUTPATIENT_CLINIC_OR_DEPARTMENT_OTHER)
Admission: RE | Admit: 2018-09-09 | Discharge: 2018-09-09 | Disposition: A | Discharge status: Home / Self Care | Payer: PRIVATE HEALTH INSURANCE | Source: Ambulatory Visit | Attending: Family Medicine | Admitting: Family Medicine

## 2018-09-09 DIAGNOSIS — I10 Essential (primary) hypertension: Secondary | ICD-10-CM | POA: Insufficient documentation

## 2018-09-09 DIAGNOSIS — R42 Dizziness and giddiness: Secondary | ICD-10-CM | POA: Diagnosis not present

## 2018-09-09 DIAGNOSIS — I440 Atrioventricular block, first degree: Secondary | ICD-10-CM | POA: Diagnosis present

## 2018-09-09 DIAGNOSIS — R5383 Other fatigue: Secondary | ICD-10-CM

## 2018-09-09 NOTE — Progress Notes (Signed)
  Echocardiogram 2D Echocardiogram has been performed.  Trevor Guerrero 09/09/2018, 3:25 PM

## 2018-10-11 NOTE — Addendum Note (Signed)
Addended by: Alena Bills R on: 10/11/2018 03:14 PM   Modules accepted: Orders

## 2018-10-17 ENCOUNTER — Other Ambulatory Visit: Payer: Self-pay | Admitting: Family Medicine

## 2018-10-27 ENCOUNTER — Encounter: Payer: Self-pay | Admitting: Family Medicine

## 2018-10-27 ENCOUNTER — Ambulatory Visit (INDEPENDENT_AMBULATORY_CARE_PROVIDER_SITE_OTHER): Payer: PRIVATE HEALTH INSURANCE | Admitting: Family Medicine

## 2018-10-27 ENCOUNTER — Other Ambulatory Visit: Payer: Self-pay

## 2018-10-27 VITALS — BP 134/70 | HR 58 | Temp 97.9°F | Wt 256.0 lb

## 2018-10-27 DIAGNOSIS — E782 Mixed hyperlipidemia: Secondary | ICD-10-CM | POA: Diagnosis not present

## 2018-10-27 DIAGNOSIS — R350 Frequency of micturition: Secondary | ICD-10-CM | POA: Diagnosis not present

## 2018-10-27 DIAGNOSIS — N401 Enlarged prostate with lower urinary tract symptoms: Secondary | ICD-10-CM | POA: Diagnosis not present

## 2018-10-27 DIAGNOSIS — I10 Essential (primary) hypertension: Secondary | ICD-10-CM

## 2018-10-27 MED ORDER — OMEPRAZOLE 40 MG PO CPDR: 40.0000 mg | DELAYED_RELEASE_CAPSULE | Freq: Two times a day (BID) | ORAL | 3 refills | Status: DC

## 2018-10-27 MED ORDER — ATORVASTATIN CALCIUM 20 MG PO TABS: 20.0000 mg | ORAL_TABLET | Freq: Every day | ORAL | 3 refills | Status: DC

## 2018-10-27 MED ORDER — TAMSULOSIN HCL 0.4 MG PO CAPS: 0.4000 mg | ORAL_CAPSULE | Freq: Every day | ORAL | 3 refills | Status: DC

## 2018-10-27 MED ORDER — LISINOPRIL 10 MG PO TABS: 10.0000 mg | ORAL_TABLET | Freq: Every day | ORAL | 3 refills | Status: DC

## 2018-10-27 MED ORDER — SILDENAFIL CITRATE 100 MG PO TABS: 50.0000 mg | ORAL_TABLET | Freq: Every day | ORAL | 11 refills | Status: DC | PRN

## 2018-10-27 NOTE — Progress Notes (Signed)
Trevor Guerrero is a 58 y.o. male who presents to [place] [place] today for follow-up hypertension, hyperlipidemia and discuss prostate problem.  Hypertension: Managed with lisinopril.  Patient tolerates medication well with no issues.  No chest pain palpitation shortness of breath.    Hyperlipidemia: Managed with atorvastatin and aspirin.  No significant body aches aches or pains.  Patient was seen a few months ago for fatigue and is feeling a lot better now.  However he notes he is having some urinary frequency and urgency and some cramping after he urinates.  Sometimes he typically gets up once per night to urinate.  This has been ongoing for few months worsening slowly. AUA symptom score 8/35.  Quality-of-life score 1/6 see scanned document.    ROS as above:  Exam:  BP 134/70   Pulse (!) 58   Temp 97.9 F (36.6 C) (Oral)   Wt 256 lb (116.1 kg)   BMI 37.80 kg/m  Wt Readings from Last 5 Encounters:  10/27/18 256 lb (116.1 kg)  08/19/18 254 lb (115.2 kg)  01/28/18 244 lb (110.7 kg)  12/24/17 247 lb (112 kg)  12/07/17 252 lb (114.3 kg)    Gen: Well NAD HEENT: EOMI,  MMM Lungs: Normal work of breathing. CTABL Heart: RRR no MRG Abd: NABS, Soft. Nondistended, Nontender Exts: Brisk capillary refill, warm and well perfused.  Rectal exam: Normal rectal tone.  Prostate slightly enlarged nontender no masses.  Lab and Radiology Results No results found for this or any previous visit (from the past 72 hour(s)). No results found.    Assessment and Plan: 58 y.o. male with  Urinary symptoms.  Very likely BPH.  Plan for trial of Flomax.  Patient will be due for fasting labs including PSA with physical anywhere from December to February.  We will go ahead and get labs at that visit.  Hypertension: Doing well.  Metabolic panel checked recently.  Continue current  regimen.  Anticipate fasting labs with physical in February.  Hyperlipidemia doing well continue current regimen.  Check fasting lipids with physical in February.  Patient will schedule for physical with new PCP Trevor Guerrero in February.  PDMP not reviewed this encounter. No orders of the defined types were placed in this encounter.  Meds ordered this encounter  Medications  . atorvastatin (LIPITOR) 20 MG tablet    Sig: Take 1 tablet (20 mg total) by mouth daily.    Dispense:  90 tablet    Refill:  3  . lisinopril (ZESTRIL) 10 MG tablet    Sig: Take 1 tablet (10 mg total) by mouth daily.    Dispense:  90 tablet    Refill:  3  . omeprazole (PRILOSEC) 40 MG capsule    Sig: Take 1 capsule (40 mg total) by mouth 2 (two) times daily.    Dispense:  180 capsule    Refill:  3  . sildenafil (VIAGRA) 100 MG tablet    Sig: Take 0.5-1 tablets (50-100 mg total) by mouth daily as needed for erectile dysfunction.    Dispense:  30 tablet    Refill:  11  . tamsulosin (FLOMAX) 0.4 MG CAPS capsule    Sig: Take 1 capsule (0.4 mg total) by mouth daily after breakfast.    Dispense:  90 capsule    Refill:  3     Historical information moved to improve visibility of documentation.  Past Medical History:  Diagnosis Date  .  Erectile dysfunction 03/28/2014  . Essential hypertension 02/28/2015  . Family history of alpha 1 antitrypsin deficiency 12/07/2017  . First degree AV block 05/18/2015   Normal 48 Hour Holter Monitor May 2017   . 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  Less than 57 is considered severe  . Hyperlipidemia 11/16/2013   AHA 10 year risk of 9.0% Nov 2016   . Solitary pulmonary nodule 11/24/2013   In addition, there is a non calcified right middle lobe nodule measuring 4-5 mm. This may reflect a noncalcified granuloma. No follow-up needed if patient is low-risk. Non-contrast chest CT can be considered in 12 months if patient is high-risk  Plan to  repeat CT in 1 year   Past Surgical History:  Procedure Laterality Date  . APPENDECTOMY    . left testicular mass Left    Social History   Tobacco Use  . Smoking status: Former Smoker    Packs/day: 0.25    Years: 2.00    Pack years: 0.50    Types: Cigarettes    Quit date: 11/16/1979    Years since quitting: 38.9  . Smokeless tobacco: Never Used  Substance Use Topics  . Alcohol use: No    Alcohol/week: 0.0 standard drinks   family history includes Alpha-1 antitrypsin deficiency in his sister.  Medications: Current Outpatient Medications  Medication Sig Dispense Refill  . aspirin 81 MG tablet Take 81 mg by mouth daily.    Marland Kitchen atorvastatin (LIPITOR) 20 MG tablet Take 1 tablet (20 mg total) by mouth daily. 90 tablet 3  . diclofenac sodium (VOLTAREN) 1 % GEL Apply 4 g topically 4 (four) times daily. To affected joint. 100 g 11  . lisinopril (ZESTRIL) 10 MG tablet Take 1 tablet (10 mg total) by mouth daily. 90 tablet 3  . omeprazole (PRILOSEC) 40 MG capsule Take 1 capsule (40 mg total) by mouth 2 (two) times daily. 180 capsule 3  . sildenafil (VIAGRA) 100 MG tablet Take 0.5-1 tablets (50-100 mg total) by mouth daily as needed for erectile dysfunction. 30 tablet 11  . tamsulosin (FLOMAX) 0.4 MG CAPS capsule Take 1 capsule (0.4 mg total) by mouth daily after breakfast. 90 capsule 3   No current facility-administered medications for this visit.    No Known Allergies   Discussed warning signs or symptoms. Please see discharge instructions. Patient expresses understanding.

## 2018-10-27 NOTE — Patient Instructions (Signed)
Thank you for coming in today. Continue medicine.  Schedule physical with Dr Ashley Royalty in February.  Start flomax.  Ok to stop if not better. Let me know.   Tamsulosin capsules What is this medicine? TAMSULOSIN (tam SOO loe sin) is an alpha blocker. It is used to treat the signs and symptoms of an enlarged prostate in men. This condition is also called benign prostatic hyperplasia (BPH). This medicine may be used for other purposes; ask your health care provider or pharmacist if you have questions. COMMON BRAND NAME(S): Flomax What should I tell my health care provider before I take this medicine? They need to know if you have any of the following conditions:  advanced kidney disease  advanced liver disease  low blood pressure  prostate cancer  an unusual or allergic reaction to tamsulosin, sulfa drugs, other medicines, foods, dyes, or preservatives  pregnant or trying to get pregnant  breast-feeding How should I use this medicine? Take this medicine by mouth about 30 minutes after the same meal every day. Follow the directions on the prescription label. Swallow the capsules whole with a glass of water. Do not crush, chew, or open capsules. Do not take your medicine more often than directed. Do not stop taking your medicine unless your doctor tells you to. Talk to your pediatrician regarding the use of this medicine in children. Special care may be needed. Overdosage: If you think you have taken too much of this medicine contact a poison control center or emergency room at once. NOTE: This medicine is only for you. Do not share this medicine with others. What if I miss a dose? If you miss a dose, take it as soon as you can. If it is almost time for your next dose, take only that dose. Do not take double or extra doses. If you stop taking your medicine for several days or more, ask your doctor or health care professional what dose you should start back on. What may interact with this  medicine?  cimetidine  fluoxetine  ketoconazole  medicines for erectile disfunction like sildenafil, tadalafil, vardenafil  medicines for high blood pressure  other alpha-blockers like alfuzosin, doxazosin, phentolamine, phenoxybenzamine, prazosin, terazosin  warfarin This list may not describe all possible interactions. Give your health care provider a list of all the medicines, herbs, non-prescription drugs, or dietary supplements you use. Also tell them if you smoke, drink alcohol, or use illegal drugs. Some items may interact with your medicine. What should I watch for while using this medicine? Visit your doctor or health care professional for regular check ups. You will need lab work done before you start this medicine and regularly while you are taking it. Check your blood pressure as directed. Ask your health care professional what your blood pressure should be, and when you should contact him or her. This medicine may make you feel dizzy or lightheaded. This is more likely to happen after the first dose, after an increase in dose, or during hot weather or exercise. Drinking alcohol and taking some medicines can make this worse. Do not drive, use machinery, or do anything that needs mental alertness until you know how this medicine affects you. Do not sit or stand up quickly. If you begin to feel dizzy, sit down until you feel better. These effects can decrease once your body adjusts to the medicine. Contact your doctor or health care professional right away if you have an erection that lasts longer than 4 hours or if it  becomes painful. This may be a sign of a serious problem and must be treated right away to prevent permanent damage. If you are thinking of having cataract surgery, tell your eye surgeon that you have taken this medicine. What side effects may I notice from receiving this medicine? Side effects that you should report to your doctor or health care professional as soon as  possible:  allergic reactions like skin rash or itching, hives, swelling of the lips, mouth, tongue, or throat  breathing problems  change in vision  feeling faint or lightheaded  irregular heartbeat  prolonged or painful erection  weakness Side effects that usually do not require medical attention (report to your doctor or health care professional if they continue or are bothersome):  back pain  change in sex drive or performance  constipation, nausea or vomiting  cough  drowsy  runny or stuffy nose  trouble sleeping This list may not describe all possible side effects. Call your doctor for medical advice about side effects. You may report side effects to FDA at 1-800-FDA-1088. Where should I keep my medicine? Keep out of the reach of children. Store at room temperature between 15 and 30 degrees C (59 and 86 degrees F). Throw away any unused medicine after the expiration date. NOTE: This sheet is a summary. It may not cover all possible information. If you have questions about this medicine, talk to your doctor, pharmacist, or health care provider.  2020 Elsevier/Gold Standard (2017-05-28 12:54:06)   Benign Prostatic Hyperplasia  Benign prostatic hyperplasia (BPH) is an enlarged prostate gland that is caused by the normal aging process and not by cancer. The prostate is a walnut-sized gland that is involved in the production of semen. It is located in front of the rectum and below the bladder. The bladder stores urine and the urethra is the tube that carries the urine out of the body. The prostate may get bigger as a man gets older. An enlarged prostate can press on the urethra. This can make it harder to pass urine. The build-up of urine in the bladder can cause infection. Back pressure and infection may progress to bladder damage and kidney (renal) failure. What are the causes? This condition is part of a normal aging process. However, not all men develop problems from  this condition. If the prostate enlarges away from the urethra, urine flow will not be blocked. If it enlarges toward the urethra and compresses it, there will be problems passing urine. What increases the risk? This condition is more likely to develop in men over the age of 50 years. What are the signs or symptoms? Symptoms of this condition include:  Getting up often during the night to urinate.  Needing to urinate frequently during the day.  Difficulty starting urine flow.  Decrease in size and strength of your urine stream.  Leaking (dribbling) after urinating.  Inability to pass urine. This needs immediate treatment.  Inability to completely empty your bladder.  Pain when you pass urine. This is more common if there is also an infection.  Urinary tract infection (UTI). How is this diagnosed? This condition is diagnosed based on your medical history, a physical exam, and your symptoms. Tests will also be done, such as:  A post-void bladder scan. This measures any amount of urine that may remain in your bladder after you finish urinating.  A digital rectal exam. In a rectal exam, your health care provider checks your prostate by putting a lubricated, gloved finger  into your rectum to feel the back of your prostate gland. This exam detects the size of your gland and any abnormal lumps or growths.  An exam of your urine (urinalysis).  A prostate specific antigen (PSA) screening. This is a blood test used to screen for prostate cancer.  An ultrasound. This test uses sound waves to electronically produce a picture of your prostate gland. Your health care provider may refer you to a specialist in kidney and prostate diseases (urologist). How is this treated? Once symptoms begin, your health care provider will monitor your condition (active surveillance or watchful waiting). Treatment for this condition will depend on the severity of your condition. Treatment may  include:  Observation and yearly exams. This may be the only treatment needed if your condition and symptoms are mild.  Medicines to relieve your symptoms, including: ? Medicines to shrink the prostate. ? Medicines to relax the muscle of the prostate.  Surgery in severe cases. Surgery may include: ? Prostatectomy. In this procedure, the prostate tissue is removed completely through an open incision or with a laparoscope or robotics. ? Transurethral resection of the prostate (TURP). In this procedure, a tool is inserted through the opening at the tip of the penis (urethra). It is used to cut away tissue of the inner core of the prostate. The pieces are removed through the same opening of the penis. This removes the blockage. ? Transurethral incision (TUIP). In this procedure, small cuts are made in the prostate. This lessens the prostate's pressure on the urethra. ? Transurethral microwave thermotherapy (TUMT). This procedure uses microwaves to create heat. The heat destroys and removes a small amount of prostate tissue. ? Transurethral needle ablation (TUNA). This procedure uses radio frequencies to destroy and remove a small amount of prostate tissue. ? Interstitial laser coagulation ([place]). This procedure uses a laser to destroy and remove a small amount of prostate tissue. ? Transurethral electrovaporization (TUVP). This procedure uses electrodes to destroy and remove a small amount of prostate tissue. ? Prostatic urethral lift. This procedure inserts an implant to push the lobes of the prostate away from the urethra. Follow these instructions at home:  Take over-the-counter and prescription medicines only as told by your health care provider.  Monitor your symptoms for any changes. Contact your health care provider with any changes.  Avoid drinking large amounts of liquid before going to bed or out in public.  Avoid or reduce how much caffeine or alcohol you drink.  Give yourself time  when you urinate.  Keep all follow-up visits as told by your health care provider. This is important. Contact a health care provider if:  You have unexplained back pain.  Your symptoms do not get better with treatment.  You develop side effects from the medicine you are taking.  Your urine becomes very dark or has a bad smell.  Your lower abdomen becomes distended and you have trouble passing your urine. Get help right away if:  You have a fever or chills.  You suddenly cannot urinate.  You feel lightheaded, or very dizzy, or you faint.  There are large amounts of blood or clots in the urine.  Your urinary problems become hard to manage.  You develop moderate to severe low back or flank pain. The flank is the side of your body between the ribs and the hip. These symptoms may represent a serious problem that is an emergency. Do not wait to see if the symptoms will go away. Get medical  help right away. Call your local emergency services (911 in the [place] Do not drive yourself to the hospital. Summary  Benign prostatic hyperplasia (BPH) is an enlarged prostate that is caused by the normal aging process and not by cancer.  An enlarged prostate can press on the urethra. This can make it hard to pass urine.  This condition is part of a normal aging process and is more likely to develop in men over the age of 50 years.  Get help right away if you suddenly cannot urinate. This information is not intended to replace advice given to you by your health care provider. Make sure you discuss any questions you have with your health care provider. Document Released: 12/23/2004 Document Revised: 11/17/2017 Document Reviewed: 01/28/2016 Elsevier Patient Education  2020 [employer]

## 2018-12-26 ENCOUNTER — Other Ambulatory Visit: Payer: Self-pay | Admitting: Family Medicine

## 2019-02-09 ENCOUNTER — Other Ambulatory Visit: Payer: Self-pay

## 2019-02-09 ENCOUNTER — Ambulatory Visit (INDEPENDENT_AMBULATORY_CARE_PROVIDER_SITE_OTHER): Payer: Commercial Managed Care - PPO | Admitting: Family Medicine

## 2019-02-09 ENCOUNTER — Encounter: Payer: Self-pay | Admitting: Family Medicine

## 2019-02-09 DIAGNOSIS — K219 Gastro-esophageal reflux disease without esophagitis: Secondary | ICD-10-CM | POA: Diagnosis not present

## 2019-02-09 MED ORDER — OMEPRAZOLE 40 MG PO CPDR: 40.0000 mg | DELAYED_RELEASE_CAPSULE | Freq: Every day | ORAL | 3 refills | Status: DC

## 2019-02-09 NOTE — Progress Notes (Signed)
Trevor Guerrero - 59 y.o. male MRN [id number]  Date of birth: 01-Jun-1960  Subjective Chief Complaint  Patient presents with  . Follow-up    HPI Trevor Guerrero is a 59 y.o. male here today with complaint of GERD.  He reports that he has been out of omeprazole for about 3-4 days.  He states that insurance with not cover at BID dosing anymore.  He has been taking once daily for the past 3 weeks and once daily dosing has controlled symptoms well.  He has not tried anything else so far.  He denies nausea, chest tightness, shortness of breath.   ROS:  A comprehensive ROS was completed and negative except as noted per HPI  No Known Allergies  Past Medical History:  Diagnosis Date  . Erectile dysfunction 03/28/2014  . Essential hypertension 02/28/2015  . Family history of alpha 1 antitrypsin deficiency 12/07/2017  . First degree AV block 05/18/2015   Normal 48 Hour Holter Monitor May 2017   . Heterozygous alpha 1-antitrypsin deficiency ([place]) 12/17/2017   Level at 75.  Less than 57 is considered severe  . Hyperlipidemia 11/16/2013   AHA 10 year risk of 9.0% Nov 2016   . Solitary pulmonary nodule 11/24/2013   In addition, there is a non calcified right middle lobe nodule measuring 4-5 mm. This may reflect a noncalcified granuloma. No follow-up needed if patient is low-risk. Non-contrast chest CT can be considered in 12 months if patient is high-risk  Plan to repeat CT in 1 year    Past Surgical History:  Procedure Laterality Date  . APPENDECTOMY    . left testicular mass Left     Social History   Socioeconomic History  . Marital status: Married    Spouse name: Not on file  . Number of children: Not on file  . Years of education: Not on file  . Highest education level: Not on file  Occupational History  . Not on file  Tobacco Use  . Smoking status: Former Smoker    Packs/day: 0.25    Years: 2.00    Pack years: 0.50    Types: Cigarettes    Quit date: 11/16/1979    Years since  quitting: 39.2  . Smokeless tobacco: Never Used  Substance and Sexual Activity  . Alcohol use: No    Alcohol/week: 0.0 standard drinks  . Drug use: No  . Sexual activity: Yes    Partners: Female  Other Topics Concern  . Not on file  Social History Narrative  . Not on file   Social Determinants of Health   Financial Resource Strain:   . Difficulty of Paying Living Expenses: Not on file  Food Insecurity:   . Worried About Charity fundraiser in the Last Year: Not on file  . Ran Out of Food in the Last Year: Not on file  Transportation Needs:   . Lack of Transportation (Medical): Not on file  . Lack of Transportation (Non-Medical): Not on file  Physical Activity:   . Days of Exercise per Week: Not on file  . Minutes of Exercise per Session: Not on file  Stress:   . Feeling of Stress : Not on file  Social Connections:   . Frequency of Communication with Friends and Family: Not on file  . Frequency of Social Gatherings with Friends and Family: Not on file  . Attends Religious Services: Not on file  . Active Member of Clubs or Organizations: Not on file  . Attends Club  or Organization Meetings: Not on file  . Marital Status: Not on file    Family History  Problem Relation Age of Onset  . Alpha-1 antitrypsin deficiency Sister   . Asthma Neg Hx   . Heart failure Neg Hx   . Hypertension Neg Hx     Health Maintenance  Topic Date Due  . COLONOSCOPY  01/03/2024  . TETANUS/TDAP  12/03/2026  . INFLUENZA VACCINE  Completed  . Hepatitis C Screening  Completed  . HIV Screening  Completed    ----------------------------------------------------------------------------------------------------------------------------------------------------------------------------------------------------------------- Physical Exam BP (!) 149/91   Pulse (!) 58   Temp 98.2 F (36.8 C) (Oral)   Ht 5\' 8"  (1.727 m)   Wt 256 lb (116.1 kg)   BMI 38.92 kg/m   Physical Exam HENT:     Head:  Normocephalic and atraumatic.  Cardiovascular:     Rate and Rhythm: Normal rate and regular rhythm.  Pulmonary:     Effort: Pulmonary effort is normal.     Breath sounds: Normal breath sounds.  Abdominal:     General: Abdomen is flat. Bowel sounds are normal. There is no distension.     Palpations: Abdomen is soft.     Tenderness: There is no abdominal tenderness.  Skin:    General: Skin is warm and dry.  Neurological:     General: No focal deficit present.     Mental Status: He is alert.  Psychiatric:        Mood and Affect: Mood normal.        Behavior: Behavior normal.     ------------------------------------------------------------------------------------------------------------------------------------------------------------------------------------------------------------------- Assessment and Plan  GERD (gastroesophageal reflux disease) Out of medication with increased symptoms.  Insurance will not cover BID dosing however he did have good symptom control using only once per day. Will send in rx for Omeprazole 40mg  once daily. He states that he will not be able to pick up at pharmacy until later tomorrow.  I let him know that omeprazole is also available OTC and he can pick up a bottle to use temporarily until he is able to pick up rx at pharmacy.   Alternatively he may use Tums or Maalox temporarily.      This visit occurred during the SARS-CoV-2 public health emergency.  Safety protocols were in place, including screening questions prior to the visit, additional usage of staff PPE, and extensive cleaning of exam room while observing appropriate contact time as indicated for disinfecting solutions.

## 2019-02-09 NOTE — Patient Instructions (Signed)
Great to meet you! I have sent over prescription for omeprazole 40mg  daily  You can pick up a bottle over the counter at any pharmacy if you aren't able to make it your pharmacy today.  These come as 20mg  so you will need to take 2 each day.  You may also use tums or maalox temporarily.   Let me know if symptoms are not improving.

## 2019-02-09 NOTE — Assessment & Plan Note (Signed)
Out of medication with increased symptoms.  Insurance will not cover BID dosing however he did have good symptom control using only once per day. Will send in rx for Omeprazole 40mg  once daily. He states that he will not be able to pick up at pharmacy until later tomorrow.  I let him know that omeprazole is also available OTC and he can pick up a bottle to use temporarily until he is able to pick up rx at pharmacy.   Alternatively he may use Tums or Maalox temporarily.

## 2019-04-22 ENCOUNTER — Other Ambulatory Visit: Payer: Self-pay | Admitting: Family Medicine

## 2019-04-22 MED ORDER — LISINOPRIL 10 MG PO TABS: 10.0000 mg | ORAL_TABLET | Freq: Every day | ORAL | 1 refills | Status: DC

## 2019-08-23 ENCOUNTER — Ambulatory Visit (INDEPENDENT_AMBULATORY_CARE_PROVIDER_SITE_OTHER): Payer: Commercial Managed Care - PPO | Admitting: Family Medicine

## 2019-08-23 ENCOUNTER — Encounter: Payer: Self-pay | Admitting: Family Medicine

## 2019-08-23 ENCOUNTER — Other Ambulatory Visit: Payer: Self-pay

## 2019-08-23 VITALS — BP 134/85 | HR 58 | Temp 97.8°F | Wt 247.0 lb

## 2019-08-23 DIAGNOSIS — R339 Retention of urine, unspecified: Secondary | ICD-10-CM

## 2019-08-23 DIAGNOSIS — R55 Syncope and collapse: Secondary | ICD-10-CM | POA: Insufficient documentation

## 2019-08-23 DIAGNOSIS — I10 Essential (primary) hypertension: Secondary | ICD-10-CM | POA: Diagnosis not present

## 2019-08-23 MED ORDER — TAMSULOSIN HCL 0.4 MG PO CAPS: 0.4000 mg | ORAL_CAPSULE | Freq: Every day | ORAL | 3 refills | Status: DC

## 2019-08-23 MED ORDER — ATORVASTATIN CALCIUM 20 MG PO TABS: 20.0000 mg | ORAL_TABLET | Freq: Every day | ORAL | 3 refills | Status: DC

## 2019-08-23 MED ORDER — OMEPRAZOLE 40 MG PO CPDR: 40.0000 mg | DELAYED_RELEASE_CAPSULE | Freq: Every day | ORAL | 3 refills | Status: DC

## 2019-08-23 NOTE — Assessment & Plan Note (Signed)
History of BPH Check PSA and UA today.

## 2019-08-23 NOTE — Progress Notes (Signed)
Trevor Guerrero - 59 y.o. male MRN [id number]  Date of birth: 1960/10/27  Subjective Chief Complaint  Patient presents with  . Hypertension    HPI Proctor Carriker is a 59 y.o. male here today to discuss HTN.  He has a history of HTN however has been holding rx for lisinopril after experiencing a syncopal episode on 04/04/19.  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  Initially felt like he had to have a bowel movement and then started having tingling sensation.  Pulled his truck over and that is the last thing he remembers.  He woke up and was sweaty with incontinence of bladder.  He was seen in ED and has had evals by neuro and cardiology including EEG, MRI, echo, holter monitor stress test and cardiac catheterization.  All of these tests were normal.  He was told that his BP may have gotten too low which is why he has continued to hold his lisinopril.  He also has been trying to do better with hydration.  He has had some issues with incomplete emptying of his bladder since increasing fluid intake which has resulted in some incontinence.  He denies pain with urination, fever, chills.  He has not had chest pain, shortness of breath, palpitations, headache or dizziness.   ROS:  A comprehensive ROS was completed and negative except as noted per HPI  No Known Allergies  Past Medical History:  Diagnosis Date  . Erectile dysfunction 03/28/2014  . Essential hypertension 02/28/2015  . Family history of alpha 1 antitrypsin deficiency 12/07/2017  . First degree AV block 05/18/2015   Normal 48 Hour Holter Monitor May 2017   . 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  Less than 57 is considered severe  . Hyperlipidemia 11/16/2013   AHA 10 year risk of 9.0% Nov 2016   . Solitary pulmonary nodule 11/24/2013   In addition, there is a non calcified right middle lobe nodule measuring 4-5 mm. This may reflect a noncalcified granuloma. No follow-up needed if  patient is low-risk. Non-contrast chest CT can be considered in 12 months if patient is high-risk  Plan to repeat CT in 1 year    Past Surgical History:  Procedure Laterality Date  . APPENDECTOMY    . left testicular mass Left     Social History   Socioeconomic History  . Marital status: Married    Spouse name: Not on file  . Number of children: Not on file  . Years of education: Not on file  . Highest education level: Not on file  Occupational History  . Not on file  Tobacco Use  . Smoking status: Former Smoker    Packs/day: 0.25    Years: 2.00    Pack years: 0.50    Types: Cigarettes    Quit date: 11/16/1979    Years since quitting: 39.7  . Smokeless tobacco: Never Used  Substance and Sexual Activity  . Alcohol use: No    Alcohol/week: 0.0 standard drinks  . Drug use: No  . Sexual activity: Yes    Partners: Female  Other Topics Concern  . Not on file  Social History Narrative  . Not on file   Social Determinants of Health   Financial Resource Strain:   . Difficulty of Paying Living Expenses:   Food Insecurity:   . Worried About Programme researcher, broadcasting/film/video in the Last Year:   . [employer] of Food in the Last Year:  Transportation Needs:   . Freight forwarder (Medical):   Marland Kitchen Lack of Transportation (Non-Medical):   Physical Activity:   . Days of Exercise per Week:   . Minutes of Exercise per Session:   Stress:   . Feeling of Stress :   Social Connections:   . Frequency of Communication with Friends and Family:   . Frequency of Social Gatherings with Friends and Family:   . Attends Religious Services:   . Active Member of Clubs or Organizations:   . Attends Banker Meetings:   Marland Kitchen Marital Status:     Family History  Problem Relation Age of Onset  . Alpha-1 antitrypsin deficiency Sister   . Asthma Neg Hx   . Heart failure Neg Hx   . Hypertension Neg Hx     Health Maintenance  Topic Date Due  . COVID-19 Vaccine (1) Never done  . INFLUENZA  VACCINE  08/07/2019  . COLONOSCOPY  01/03/2024  . TETANUS/TDAP  12/03/2026  . Hepatitis C Screening  Completed  . HIV Screening  Completed     ----------------------------------------------------------------------------------------------------------------------------------------------------------------------------------------------------------------- Physical Exam There were no vitals taken for this visit.  Physical Exam HENT:     Head: Normocephalic and atraumatic.  Eyes:     General: No scleral icterus. Cardiovascular:     Rate and Rhythm: Normal rate and regular rhythm.  Pulmonary:     Effort: Pulmonary effort is normal.     Breath sounds: Normal breath sounds.  Abdominal:     General: There is no distension.     Palpations: Abdomen is soft.     Tenderness: There is no abdominal tenderness.  Musculoskeletal:     Cervical back: Neck supple.  Neurological:     General: No focal deficit present.     Mental Status: He is alert.  Psychiatric:        Mood and Affect: Mood normal.        Behavior: Behavior normal.     ------------------------------------------------------------------------------------------------------------------------------------------------------------------------------------------------------------------- Assessment and Plan  Incomplete emptying of bladder History of BPH Check PSA and UA today.   Syncope Assumed to be related to hypotension.  Extensive Cardiac and neuro work up without any significant findings Recommend he remain well hydrated as well.    Essential hypertension BP elevated initially but normal on recheck towards end of his appt.  Will hold off on restarting lisinopril with his previous syncopal episode.  F/u in about 3 months.    Meds ordered this encounter  Medications  . tamsulosin (FLOMAX) 0.4 MG CAPS capsule    Sig: Take 1 capsule (0.4 mg total) by mouth daily after breakfast.    Dispense:  90 capsule    Refill:  3  .  omeprazole (PRILOSEC) 40 MG capsule    Sig: Take 1 capsule (40 mg total) by mouth daily.    Dispense:  90 capsule    Refill:  3  . atorvastatin (LIPITOR) 20 MG tablet    Sig: Take 1 tablet (20 mg total) by mouth daily.    Dispense:  90 tablet    Refill:  3    No follow-ups on file.    This visit occurred during the SARS-CoV-2 public health emergency.  Safety protocols were in place, including screening questions prior to the visit, additional usage of staff PPE, and extensive cleaning of exam room while observing appropriate contact time as indicated for disinfecting solutions.

## 2019-08-23 NOTE — Assessment & Plan Note (Signed)
Assumed to be related to hypotension.  Extensive Cardiac and neuro work up without any significant findings Recommend he remain well hydrated as well.

## 2019-08-23 NOTE — Assessment & Plan Note (Signed)
BP elevated initially but normal on recheck towards end of his appt.  Will hold off on restarting lisinopril with his previous syncopal episode.  F/u in about 3 months.

## 2019-08-23 NOTE — Patient Instructions (Signed)
Recheck of your blood pressure is better.  I don't think you need blood pressure medications right now.   Have labs completed See me again in about 3 months.

## 2019-08-24 LAB — URINALYSIS, ROUTINE W REFLEX MICROSCOPIC
Bilirubin Urine: NEGATIVE
Glucose, UA: NEGATIVE
Hgb urine dipstick: NEGATIVE
Ketones, ur: NEGATIVE
Leukocytes,Ua: NEGATIVE
Nitrite: NEGATIVE
Protein, ur: NEGATIVE
Specific Gravity, Urine: 1.019 (ref 1.001–1.03)
pH: 5.5 (ref 5.0–8.0)

## 2019-08-24 LAB — PSA: PSA: 0.3 ng/mL (ref <=4.0)

## 2019-08-24 LAB — BASIC METABOLIC PANEL
BUN: 13 mg/dL (ref 7–25)
CO2: 25 mmol/L (ref 20–32)
Calcium: 10.1 mg/dL (ref 8.6–10.3)
Chloride: 106 mmol/L (ref 98–110)
Creat: 0.94 mg/dL (ref 0.70–1.33)
Glucose, Bld: 107 mg/dL — ABNORMAL HIGH (ref 65–99)
Potassium: 4.2 mmol/L (ref 3.5–5.3)
Sodium: 139 mmol/L (ref 135–146)

## 2019-08-24 LAB — CBC
HCT: 43.4 % (ref 38.5–50.0)
Hemoglobin: 14.6 g/dL (ref 13.2–17.1)
MCH: 29.7 pg (ref 27.0–33.0)
MCHC: 33.6 g/dL (ref 32.0–36.0)
MCV: 88.2 fL (ref 80.0–100.0)
MPV: 9.7 fL (ref 7.5–12.5)
Platelets: 229 10*3/uL (ref 140–400)
RBC: 4.92 10*6/uL (ref 4.20–5.80)
RDW: 12.6 % (ref 11.0–15.0)
WBC: 4.1 10*3/uL (ref 3.8–10.8)

## 2019-11-14 ENCOUNTER — Other Ambulatory Visit: Payer: Self-pay | Admitting: Family Medicine

## 2019-11-23 ENCOUNTER — Encounter: Payer: Self-pay | Admitting: Family Medicine

## 2019-11-23 ENCOUNTER — Other Ambulatory Visit: Payer: Self-pay

## 2019-11-23 ENCOUNTER — Ambulatory Visit (INDEPENDENT_AMBULATORY_CARE_PROVIDER_SITE_OTHER): Payer: Commercial Managed Care - PPO | Admitting: Family Medicine

## 2019-11-23 VITALS — BP 149/70 | HR 56

## 2019-11-23 DIAGNOSIS — I10 Essential (primary) hypertension: Secondary | ICD-10-CM | POA: Diagnosis not present

## 2019-11-23 DIAGNOSIS — E782 Mixed hyperlipidemia: Secondary | ICD-10-CM | POA: Diagnosis not present

## 2019-11-23 DIAGNOSIS — Z0000 Encounter for general adult medical examination without abnormal findings: Secondary | ICD-10-CM | POA: Diagnosis not present

## 2019-11-23 DIAGNOSIS — Z23 Encounter for immunization: Secondary | ICD-10-CM

## 2019-11-23 NOTE — Patient Instructions (Signed)
Preventive Care 41-60 Years Old, Male Preventive care refers to lifestyle choices and visits with your health care provider that can promote health and wellness. This includes:  A yearly physical exam. This is also called an annual well check.  Regular dental and eye exams.  Immunizations.  Screening for certain conditions.  Healthy lifestyle choices, such as eating a healthy diet, getting regular exercise, not using drugs or products that contain nicotine and tobacco, and limiting alcohol use. What can I expect for my preventive care visit? Physical exam Your health care provider will check:  Height and weight. These may be used to calculate body mass index (BMI), which is a measurement that tells if you are at a healthy weight.  Heart rate and blood pressure.  Your skin for abnormal spots. Counseling Your health care provider may ask you questions about:  Alcohol, tobacco, and drug use.  Emotional well-being.  Home and relationship well-being.  Sexual activity.  Eating habits.  Work and work Statistician. What immunizations do I need?  Influenza (flu) vaccine  This is recommended every year. Tetanus, diphtheria, and pertussis (Tdap) vaccine  You may need a Td booster every 10 years. Varicella (chickenpox) vaccine  You may need this vaccine if you have not already been vaccinated. Zoster (shingles) vaccine  You may need this after age 64. Measles, mumps, and rubella (MMR) vaccine  You may need at least one dose of MMR if you were born in 1957 or later. You may also need a second dose. Pneumococcal conjugate (PCV13) vaccine  You may need this if you have certain conditions and were not previously vaccinated. Pneumococcal polysaccharide (PPSV23) vaccine  You may need one or two doses if you smoke cigarettes or if you have certain conditions. Meningococcal conjugate (MenACWY) vaccine  You may need this if you have certain conditions. Hepatitis A  vaccine  You may need this if you have certain conditions or if you travel or work in places where you may be exposed to hepatitis A. Hepatitis B vaccine  You may need this if you have certain conditions or if you travel or work in places where you may be exposed to hepatitis B. Haemophilus influenzae type b (Hib) vaccine  You may need this if you have certain risk factors. Human papillomavirus (HPV) vaccine  If recommended by your health care provider, you may need three doses over 6 months. You may receive vaccines as individual doses or as more than one vaccine together in one shot (combination vaccines). Talk with your health care provider about the risks and benefits of combination vaccines. What tests do I need? Blood tests  Lipid and cholesterol levels. These may be checked every 5 years, or more frequently if you are over 60 years old.  Hepatitis C test.  Hepatitis B test. Screening  Lung cancer screening. You may have this screening every year starting at age 43 if you have a 30-pack-year history of smoking and currently smoke or have quit within the past 15 years.  Prostate cancer screening. Recommendations will vary depending on your family history and other risks.  Colorectal cancer screening. All adults should have this screening starting at age 72 and continuing until age 2. Your health care provider may recommend screening at age 14 if you are at increased risk. You will have tests every 1-10 years, depending on your results and the type of screening test.  Diabetes screening. This is done by checking your blood sugar (glucose) after you have not eaten  for a while (fasting). You may have this done every 1-3 years.  Sexually transmitted disease (STD) testing. Follow these instructions at home: Eating and drinking  Eat a diet that includes fresh fruits and vegetables, whole grains, lean protein, and low-fat dairy products.  Take vitamin and mineral supplements as  recommended by your health care provider.  Do not drink alcohol if your health care provider tells you not to drink.  If you drink alcohol: ? Limit how much you have to 0-2 drinks a day. ? Be aware of how much alcohol is in your drink. In the [place] one drink equals one 12 oz bottle of beer (355 mL), one 5 oz glass of wine (148 mL), or one 1 oz glass of hard liquor (44 mL). Lifestyle  Take daily care of your teeth and gums.  Stay active. Exercise for at least 30 minutes on 5 or more days each week.  Do not use any products that contain nicotine or tobacco, such as cigarettes, e-cigarettes, and chewing tobacco. If you need help quitting, ask your health care provider.  If you are sexually active, practice safe sex. Use a condom or other form of protection to prevent STIs (sexually transmitted infections).  Talk with your health care provider about taking a low-dose aspirin every day starting at age 53. What's next?  Go to your health care provider once a year for a well check visit.  Ask your health care provider how often you should have your eyes and teeth checked.  Stay up to date on all vaccines. This information is not intended to replace advice given to you by your health care provider. Make sure you discuss any questions you have with your health care provider. Document Revised: 12/17/2017 Document Reviewed: 12/17/2017 Elsevier Patient Education  2020 [employer].

## 2019-11-23 NOTE — Progress Notes (Signed)
Trevor Guerrero - 59 y.o. male MRN [id number]  Date of birth: 04/12/1960  Subjective Chief Complaint  Patient presents with  . Annual Exam    HPI Trevor Guerrero is a 59 y.o. male here today for annual exam.  Chronic medical problems of HTN, BPH and HLD have been pretty well controlled.   He has no new concerns today.   He is a former smoker.  He does not consume EtOH.   He does walk some for exercise.  He has been working on dietary change.   He would like flu vaccine today.    Review of Systems  Constitutional: Negative for chills, fever, malaise/fatigue and weight loss.  HENT: Negative for congestion, ear pain and sore throat.   Eyes: Negative for blurred vision, double vision and pain.  Respiratory: Negative for cough and shortness of breath.   Cardiovascular: Negative for chest pain and palpitations.  Gastrointestinal: Negative for abdominal pain, blood in stool, constipation, heartburn and nausea.  Genitourinary: Negative for dysuria and urgency.  Musculoskeletal: Negative for joint pain and myalgias.  Neurological: Negative for dizziness and headaches.  Endo/Heme/Allergies: Does not bruise/bleed easily.  Psychiatric/Behavioral: Negative for depression. The patient is not nervous/anxious and does not have insomnia.       No Known Allergies  Past Medical History:  Diagnosis Date  . Erectile dysfunction 03/28/2014  . Essential hypertension 02/28/2015  . Family history of alpha 1 antitrypsin deficiency 12/07/2017  . First degree AV block 05/18/2015   Normal 48 Hour Holter Monitor May 2017   . 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  Less than 57 is considered severe  . Hyperlipidemia 11/16/2013   AHA 10 year risk of 9.0% Nov 2016   . Solitary pulmonary nodule 11/24/2013   In addition, there is a non calcified right middle lobe nodule measuring 4-5 mm. This may reflect a noncalcified granuloma. No follow-up needed if patient is low-risk.  Non-contrast chest CT can be considered in 12 months if patient is high-risk  Plan to repeat CT in 1 year    Past Surgical History:  Procedure Laterality Date  . APPENDECTOMY    . left testicular mass Left     Social History   Socioeconomic History  . Marital status: Married    Spouse name: Not on file  . Number of children: Not on file  . Years of education: Not on file  . Highest education level: Not on file  Occupational History  . Not on file  Tobacco Use  . Smoking status: Former Smoker    Packs/day: 0.25    Years: 2.00    Pack years: 0.50    Types: Cigarettes    Quit date: 11/16/1979    Years since quitting: 40.0  . Smokeless tobacco: Never Used  Substance and Sexual Activity  . Alcohol use: No    Alcohol/week: 0.0 standard drinks  . Drug use: No  . Sexual activity: Yes    Partners: Female  Other Topics Concern  . Not on file  Social History Narrative  . Not on file   Social Determinants of Health   Financial Resource Strain:   . Difficulty of Paying Living Expenses: Not on file  Food Insecurity:   . Worried About Programme researcher, broadcasting/film/video in the Last Year: Not on file  . Ran Out of Food in the Last Year: Not on file  Transportation Needs:   . Lack of Transportation (Medical): Not on file  .  Lack of Transportation (Non-Medical): Not on file  Physical Activity:   . Days of Exercise per Week: Not on file  . Minutes of Exercise per Session: Not on file  Stress:   . Feeling of Stress : Not on file  Social Connections:   . Frequency of Communication with Friends and Family: Not on file  . Frequency of Social Gatherings with Friends and Family: Not on file  . Attends Religious Services: Not on file  . Active Member of Clubs or Organizations: Not on file  . Attends Banker Meetings: Not on file  . Marital Status: Not on file    Family History  Problem Relation Age of Onset  . Alpha-1 antitrypsin deficiency Sister   . Asthma Neg Hx   . Heart  failure Neg Hx   . Hypertension Neg Hx     Health Maintenance  Topic Date Due  . COVID-19 Vaccine (1) Never done  . COLONOSCOPY  01/03/2024  . TETANUS/TDAP  12/03/2026  . INFLUENZA VACCINE  Completed  . Hepatitis C Screening  Completed  . HIV Screening  Completed     ----------------------------------------------------------------------------------------------------------------------------------------------------------------------------------------------------------------- Physical Exam BP (!) 149/70 (BP Location: Left Arm, Patient Position: Sitting, Cuff Size: Large)   Pulse (!) 56   Physical Exam Constitutional:      General: He is not in acute distress. HENT:     Head: Normocephalic and atraumatic.     Right Ear: Tympanic membrane and external ear normal.     Left Ear: Tympanic membrane and external ear normal.  Eyes:     General: No scleral icterus. Neck:     Thyroid: No thyromegaly.  Cardiovascular:     Rate and Rhythm: Normal rate and regular rhythm.     Heart sounds: Normal heart sounds.  Pulmonary:     Effort: Pulmonary effort is normal.     Breath sounds: Normal breath sounds.  Abdominal:     General: Bowel sounds are normal. There is no distension.     Palpations: Abdomen is soft.     Tenderness: There is no abdominal tenderness. There is no guarding.  Musculoskeletal:     Cervical back: Normal range of motion.  Lymphadenopathy:     Cervical: No cervical adenopathy.  Skin:    General: Skin is warm and dry.     Findings: No rash.  Neurological:     Mental Status: He is alert and oriented to person, place, and time.     Cranial Nerves: No cranial nerve deficit.     Motor: No abnormal muscle tone.  Psychiatric:        Mood and Affect: Mood normal.        Behavior: Behavior normal.      ------------------------------------------------------------------------------------------------------------------------------------------------------------------------------------------------------------------- Assessment and Plan  Well adult exam Well adult Orders Placed This Encounter  Procedures  . Flu Vaccine QUAD 6+ mos PF IM (Fluarix Quad PF)  . COMPLETE METABOLIC PANEL WITH GFR  . Lipid Profile  Screening: Up to date. Immunizations:  Flu shot received today.   Anticipatory guidance/risk factor reduction:  Recommendations per AVS.     No orders of the defined types were placed in this encounter.   No follow-ups on file.    This visit occurred during the SARS-CoV-2 public health emergency.  Safety protocols were in place, including screening questions prior to the visit, additional usage of staff PPE, and extensive cleaning of exam room while observing appropriate contact time as indicated for disinfecting solutions.

## 2019-11-23 NOTE — Assessment & Plan Note (Signed)
Well adult Orders Placed This Encounter  Procedures  . Flu Vaccine QUAD 6+ mos PF IM (Fluarix Quad PF)  . COMPLETE METABOLIC PANEL WITH GFR  . Lipid Profile  Screening: Up to date. Immunizations:  Flu shot received today.   Anticipatory guidance/risk factor reduction:  Recommendations per AVS.

## 2019-11-24 LAB — COMPLETE METABOLIC PANEL WITH GFR
AG Ratio: 2.2 (calc) (ref 1.0–2.5)
ALT: 24 U/L (ref 9–46)
AST: 17 U/L (ref 10–35)
Albumin: 4.6 g/dL (ref 3.6–5.1)
Alkaline phosphatase (APISO): 75 U/L (ref 35–144)
BUN: 24 mg/dL (ref 7–25)
CO2: 26 mmol/L (ref 20–32)
Calcium: 9.9 mg/dL (ref 8.6–10.3)
Chloride: 106 mmol/L (ref 98–110)
Creat: 0.93 mg/dL (ref 0.70–1.25)
GFR, Est African American: 103 mL/min/{1.73_m2} (ref >=60)
GFR, Est Non African American: 89 mL/min/{1.73_m2} (ref >=60)
Globulin: 2.1 g/dL (calc) (ref 1.9–3.7)
Glucose, Bld: 102 mg/dL — ABNORMAL HIGH (ref 65–99)
Potassium: 4.4 mmol/L (ref 3.5–5.3)
Sodium: 140 mmol/L (ref 135–146)
Total Bilirubin: 0.5 mg/dL (ref 0.2–1.2)
Total Protein: 6.7 g/dL (ref 6.1–8.1)

## 2019-11-24 LAB — LIPID PANEL
Cholesterol: 136 mg/dL (ref <200)
HDL: 41 mg/dL (ref >=40)
LDL Cholesterol (Calc): 78 mg/dL (calc)
Non-HDL Cholesterol (Calc): 95 mg/dL (calc) (ref <130)
Total CHOL/HDL Ratio: 3.3 (calc) (ref <5.0)
Triglycerides: 87 mg/dL (ref <150)

## 2020-08-14 ENCOUNTER — Encounter: Payer: Self-pay | Admitting: Family Medicine

## 2020-08-14 ENCOUNTER — Ambulatory Visit: Payer: Commercial Managed Care - PPO | Admitting: Family Medicine

## 2020-08-14 ENCOUNTER — Other Ambulatory Visit: Payer: Self-pay

## 2020-08-14 DIAGNOSIS — N401 Enlarged prostate with lower urinary tract symptoms: Secondary | ICD-10-CM

## 2020-08-14 DIAGNOSIS — E782 Mixed hyperlipidemia: Secondary | ICD-10-CM

## 2020-08-14 DIAGNOSIS — I10 Essential (primary) hypertension: Secondary | ICD-10-CM

## 2020-08-14 DIAGNOSIS — R350 Frequency of micturition: Secondary | ICD-10-CM

## 2020-08-14 DIAGNOSIS — L989 Disorder of the skin and subcutaneous tissue, unspecified: Secondary | ICD-10-CM | POA: Diagnosis not present

## 2020-08-14 MED ORDER — TAMSULOSIN HCL 0.4 MG PO CAPS: 0.4000 mg | ORAL_CAPSULE | Freq: Every day | ORAL | 3 refills | Status: DC

## 2020-08-14 MED ORDER — ATORVASTATIN CALCIUM 20 MG PO TABS: ORAL_TABLET | ORAL | 3 refills | Status: DC

## 2020-08-14 MED ORDER — OMEPRAZOLE 40 MG PO CPDR: 40.0000 mg | DELAYED_RELEASE_CAPSULE | Freq: Every day | ORAL | 3 refills | Status: DC

## 2020-08-14 NOTE — Assessment & Plan Note (Signed)
Stable with Flomax.  Recommend continuation.  Update PSA at upcoming annual exam in November.

## 2020-08-14 NOTE — Assessment & Plan Note (Signed)
Blood pressure elevated initially, improved towards the end of the visit.  We will recheck once again at his upcoming visit to have skin lesion removed.  We discussed that if blood pressure does remain elevated we may need to add lisinopril back on.

## 2020-08-14 NOTE — Assessment & Plan Note (Signed)
He will return for lesion removal.

## 2020-08-14 NOTE — Progress Notes (Signed)
Trevor Guerrero - 60 y.o. male MRN [id number]  Date of birth: 1960-09-19  Subjective Chief Complaint  Patient presents with   Annual Exam    HPI Trevor Guerrero is a 60 year old male here today for follow-up visit.  We discontinued his lisinopril previously due to syncopal episode related to hypotension.  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  He denies any symptoms related to hypertension including chest pain, shortness of breath, palpitations, headache or vision changes.  He continues on atorvastatin for management of hyperlipidemia.  He is tolerating this well.  H  BPH symptoms are well controlled with tamsulosin.  He does have a skin lesion on his arm that he would like removed.  ROS:  A comprehensive ROS was completed and negative except as noted per HPI      No Known Allergies  Past Medical History:  Diagnosis Date   Erectile dysfunction 03/28/2014   Essential hypertension 02/28/2015   Family history of alpha 1 antitrypsin deficiency 12/07/2017   First degree AV block 05/18/2015   Normal 48 Hour Holter Monitor May 2017    Heterozygous alpha 1-antitrypsin deficiency (HCC) 12/17/2017   Level at 75.  Less than 57 is considered severe   Hyperlipidemia 11/16/2013   AHA 10 year risk of 9.0% Nov 2016    Solitary pulmonary nodule 11/24/2013   In addition, there is a non calcified right middle lobe nodule measuring 4-5 mm. This may reflect a noncalcified granuloma. No follow-up needed if patient is low-risk. Non-contrast chest CT can be considered in 12 months if patient is high-risk  Plan to repeat CT in 1 year    Past Surgical History:  Procedure Laterality Date   APPENDECTOMY     left testicular mass Left     Social History   Socioeconomic History   Marital status: Married    Spouse name: Not on file   Number of children: Not on file   Years of education: Not on file   Highest education level: Not on file  Occupational History   Not on file  Tobacco Use    Smoking status: Former    Packs/day: 0.25    Years: 2.00    Pack years: 0.50    Types: Cigarettes    Quit date: 11/16/1979    Years since quitting: 40.7   Smokeless tobacco: Never  Substance and Sexual Activity   Alcohol use: No    Alcohol/week: 0.0 standard drinks   Drug use: No   Sexual activity: Yes    Partners: Female  Other Topics Concern   Not on file  Social History Narrative   Not on file   Social Determinants of Health   Financial Resource Strain: Not on file  Food Insecurity: Not on file  Transportation Needs: Not on file  Physical Activity: Not on file  Stress: Not on file  Social Connections: Not on file    Family History  Problem Relation Age of Onset   Alpha-1 antitrypsin deficiency Sister    Asthma Neg Hx    Heart failure Neg Hx    Hypertension Neg Hx     Health Maintenance  Topic Date Due   Zoster Vaccines- Shingrix (1 of 2) Never done   COVID-19 Vaccine (3 - Booster for Pfizer series) 02/09/2020   INFLUENZA VACCINE  08/06/2020   COLONOSCOPY (Pts 45-16yrs Insurance coverage will need to be confirmed)  01/03/2024   TETANUS/TDAP  12/03/2026   Hepatitis C Screening  Completed   HIV Screening  Completed  Pneumococcal Vaccine 46-74 Years old  Aged Out   HPV VACCINES  Aged Out     ----------------------------------------------------------------------------------------------------------------------------------------------------------------------------------------------------------------- Physical Exam BP (!) 143/85   Pulse (!) 56   Temp 97.8 F (36.6 C)   Ht 5\' 9"  (1.753 m)   Wt 256 lb (116.1 kg)   SpO2 97%   BMI 37.80 kg/m   Physical Exam Constitutional:      Appearance: Normal appearance.  HENT:     Head: Normocephalic and atraumatic.  Eyes:     General: Scleral icterus present.  Cardiovascular:     Rate and Rhythm: Normal rate and regular rhythm.  Musculoskeletal:     Cervical back: Neck supple.  Skin:    General: Skin is warm and  dry.     Comments: Hyperkeratotic, scaly verrucous appearing lesion to the left forearm.  Neurological:     General: No focal deficit present.     Mental Status: He is alert and oriented to person, place, and time.  Psychiatric:        Mood and Affect: Mood normal.    ------------------------------------------------------------------------------------------------------------------------------------------------------------------------------------------------------------------- Assessment and Plan  No problem-specific Assessment & Plan notes found for this encounter.   Meds ordered this encounter  Medications   atorvastatin (LIPITOR) 20 MG tablet    Sig: TAKE 1 TABLET(20 MG) BY MOUTH DAILY    Dispense:  90 tablet    Refill:  3   tamsulosin (FLOMAX) 0.4 MG CAPS capsule    Sig: Take 1 capsule (0.4 mg total) by mouth daily after breakfast.    Dispense:  90 capsule    Refill:  3   omeprazole (PRILOSEC) 40 MG capsule    Sig: Take 1 capsule (40 mg total) by mouth daily.    Dispense:  90 capsule    Refill:  3    Return for Please schedule to have skin lesion removed.    This visit occurred during the SARS-CoV-2 public health emergency.  Safety protocols were in place, including screening questions prior to the visit, additional usage of staff PPE, and extensive cleaning of exam room while observing appropriate contact time as indicated for disinfecting solutions.

## 2020-08-14 NOTE — Assessment & Plan Note (Signed)
He is doing well with atorvastatin.  Recommend continuation.  We will update labs at his upcoming annual exam in November.

## 2020-08-16 ENCOUNTER — Ambulatory Visit: Payer: Commercial Managed Care - PPO | Admitting: Family Medicine

## 2020-08-16 ENCOUNTER — Other Ambulatory Visit: Payer: Self-pay

## 2020-08-16 ENCOUNTER — Other Ambulatory Visit: Payer: Self-pay | Admitting: Family Medicine

## 2020-08-16 ENCOUNTER — Encounter: Payer: Self-pay | Admitting: Family Medicine

## 2020-08-16 VITALS — BP 144/80 | HR 71 | Temp 97.8°F | Ht 69.0 in | Wt 257.1 lb

## 2020-08-16 DIAGNOSIS — L989 Disorder of the skin and subcutaneous tissue, unspecified: Secondary | ICD-10-CM

## 2020-08-16 NOTE — Progress Notes (Signed)
Trevor Guerrero - 60 y.o. male MRN [id number]  Date of birth: 08-Jul-1960  Subjective Chief Complaint  Patient presents with   Biopsy    HPI Trevor Guerrero is a 60-year-old male here today for follow-up of lesion to left arm.  Lesion has been present for a few months and is itchy with some burning pain at times when he picks at it.  He denies any bleeding around this area.  ROS:  A comprehensive ROS was completed and negative except as noted per HPI  No Known Allergies  Past Medical History:  Diagnosis Date   Erectile dysfunction 03/28/2014   Essential hypertension 02/28/2015   Family history of alpha 1 antitrypsin deficiency 12/07/2017   First degree AV block 05/18/2015   Normal 48 Hour Holter Monitor May 2017    Heterozygous alpha 1-antitrypsin deficiency (HCC) 12/17/2017   Level at 75.  Less than 57 is considered severe   Hyperlipidemia 11/16/2013   AHA 10 year risk of 9.0% Nov 2016    Solitary pulmonary nodule 11/24/2013   In addition, there is a non calcified right middle lobe nodule measuring 4-5 mm. This may reflect a noncalcified granuloma. No follow-up needed if patient is low-risk. Non-contrast chest CT can be considered in 12 months if patient is high-risk  Plan to repeat CT in 1 year    Past Surgical History:  Procedure Laterality Date   APPENDECTOMY     left testicular mass Left     Social History   Socioeconomic History   Marital status: Married    Spouse name: Not on file   Number of children: Not on file   Years of education: Not on file   Highest education level: Not on file  Occupational History   Not on file  Tobacco Use   Smoking status: Former    Packs/day: 0.25    Years: 2.00    Pack years: 0.50    Types: Cigarettes    Quit date: 11/16/1979    Years since quitting: 40.7   Smokeless tobacco: Never  Substance and Sexual Activity   Alcohol use: No    Alcohol/week: 0.0 standard drinks   Drug use: No   Sexual activity: Yes    Partners: Female  Other Topics  Concern   Not on file  Social History Narrative   Not on file   Social Determinants of Health   Financial Resource Strain: Not on file  Food Insecurity: Not on file  Transportation Needs: Not on file  Physical Activity: Not on file  Stress: Not on file  Social Connections: Not on file    Family History  Problem Relation Age of Onset   Alpha-1 antitrypsin deficiency Sister    Asthma Neg Hx    Heart failure Neg Hx    Hypertension Neg Hx     Health Maintenance  Topic Date Due   Zoster Vaccines- Shingrix (1 of 2) Never done   COVID-19 Vaccine (3 - Booster for Pfizer series) 02/09/2020   INFLUENZA VACCINE  08/06/2020   COLONOSCOPY (Pts 45-48yrs Insurance coverage will need to be confirmed)  01/03/2024   TETANUS/TDAP  12/03/2026   Hepatitis C Screening  Completed   HIV Screening  Completed   Pneumococcal Vaccine 70-75 Years old  Aged Out   HPV VACCINES  Aged Out     ----------------------------------------------------------------------------------------------------------------------------------------------------------------------------------------------------------------- Physical Exam BP (!) 144/80 (BP Location: Right Arm, Patient Position: Sitting, Cuff Size: Large)   Pulse 71   Temp 97.8 F (36.6 C)   Ht  5\' 9"  (1.753 m)   Wt 257 lb 1.9 oz (116.6 kg)   SpO2 96%   BMI 37.97 kg/m   Physical Exam Constitutional:      Appearance: Normal appearance.  Skin:    Comments: Approximately 4 mm raised verrucous appearing lesion to the left forearm.  Neurological:     Mental Status: He is alert.   Procedure note: Procedure discussed in detail with patient including potential complications.  We reviewed the potential for adverse outcomes including scarring, bleeding, and infection.  All questions answered and he elects to proceed with procedure.  He was prepped in typical sterile fashion with chlorhexidine.  Area around lesion infiltrated locally with 3 mL of 1% lidocaine with  epinephrine.  After adequate anesthesia shave biopsy was performed.  Capillary bleeding was controlled with direct pressure and aluminum chloride.  Area covered with Band-Aid.  Lesion sent for pathology.  He tolerated procedure well.  Post procedure instructions given. ------------------------------------------------------------------------------------------------------------------------------------------------------------------------------------------------------------------- Assessment and Plan  Skin lesion Shave biopsy performed today.  Tolerated well.  See procedure note.   No orders of the defined types were placed in this encounter.   No follow-ups on file.    This visit occurred during the SARS-CoV-2 public health emergency.  Safety protocols were in place, including screening questions prior to the visit, additional usage of staff PPE, and extensive cleaning of exam room while observing appropriate contact time as indicated for disinfecting solutions.

## 2020-08-16 NOTE — Assessment & Plan Note (Signed)
Shave biopsy performed today.  Tolerated well.  See procedure note.

## 2020-08-21 ENCOUNTER — Other Ambulatory Visit: Payer: Self-pay | Admitting: Family Medicine

## 2020-09-18 ENCOUNTER — Other Ambulatory Visit: Payer: Self-pay

## 2020-09-18 MED ORDER — TAMSULOSIN HCL 0.4 MG PO CAPS
0.4000 mg | ORAL_CAPSULE | Freq: Every day | ORAL | 3 refills | Status: DC
Start: 2020-09-18 — End: 2021-08-15

## 2021-06-14 ENCOUNTER — Ambulatory Visit (INDEPENDENT_AMBULATORY_CARE_PROVIDER_SITE_OTHER): Payer: Commercial Managed Care - PPO | Admitting: Physician Assistant

## 2021-06-14 ENCOUNTER — Ambulatory Visit (INDEPENDENT_AMBULATORY_CARE_PROVIDER_SITE_OTHER): Payer: Commercial Managed Care - PPO

## 2021-06-14 ENCOUNTER — Encounter: Payer: Self-pay | Admitting: Physician Assistant

## 2021-06-14 VITALS — BP 158/78 | HR 62 | Ht 69.0 in | Wt 257.0 lb

## 2021-06-14 DIAGNOSIS — M25511 Pain in right shoulder: Secondary | ICD-10-CM | POA: Diagnosis not present

## 2021-06-14 DIAGNOSIS — R202 Paresthesia of skin: Secondary | ICD-10-CM

## 2021-06-14 DIAGNOSIS — R200 Anesthesia of skin: Secondary | ICD-10-CM | POA: Diagnosis not present

## 2021-06-14 MED ORDER — MELOXICAM 15 MG PO TABS
15.0000 mg | ORAL_TABLET | Freq: Every day | ORAL | 1 refills | Status: DC
Start: 2021-06-14 — End: 2021-08-13

## 2021-06-14 NOTE — Progress Notes (Signed)
Right shoulder - down arm 2 weeks Aleve - no help Worse with driving and trying to sleep Has not tried muscle rubs/patches/ice/heat No injury

## 2021-06-14 NOTE — Progress Notes (Signed)
Acute Office Visit  Subjective:     Patient ID: Trevor Guerrero, male    DOB: 1960/11/26, 61 y.o.   MRN: [id number]  Chief Complaint  Patient presents with   Shoulder Pain    HPI Patient is in today for right shoulder pain that radiates into right arm and hand. This started about 2 weeks ago without injury. He has never had anything like this before. His whole right arm and fingers tingling and hard to move it from time to time. Worse at night when laying on it. Aleve helps very minimally. Denies any neck pain. He drives a truck.   ROS  See HPI.     Objective:    BP (!) 158/78   Pulse 62   Ht 5\' 9"  (1.753 m)   Wt 257 lb (116.6 kg)   SpO2 97%   BMI 37.95 kg/m    Physical Exam Vitals reviewed.  Constitutional:      Appearance: Normal appearance. He is obese.  HENT:     Head: Normocephalic.  Cardiovascular:     Rate and Rhythm: Normal rate.  Pulmonary:     Effort: Pulmonary effort is normal.  Musculoskeletal:     Comments: NROM of right shoulder and neck Strength of upper extremity 5/5.  Negative hawkins, Yergason  Tenderness to palpation over deltoid/acromion and lateral shoulder No tenderness over anterior shoulder Positive phalens and tinels  Neurological:     Mental Status: He is alert.  Psychiatric:        Mood and Affect: Mood normal.    Bursa Injection Procedure Note  Pre-operative Diagnosis: right subacromial bursitis  Post-operative Diagnosis: same  Indications: Diagnosis and treatment of symptomatic bursal inflammation  Procedure Details   After a discussion of the risks and benefits with the patient (including the possibility that any manipulation of the bursa could introduce infection, worsening the current situation significantly), verbal consent was obtained for the procedure.  An 18 gauge needle was introduced into the right subacromial bursa 40mg  of depo medrol 19mL and 46ml of lidocaine without epi. The injection site was cleansed with  topical isopropyl alcohol and a dressing was applied.  Complications:  None; patient tolerated the procedure well.       Assessment & Plan:  0m10mShanard was seen today for shoulder pain.  Diagnoses and all orders for this visit:  Acute pain of right shoulder -     DG Cervical Spine Complete; Future -     DG Shoulder Right; Future -     meloxicam (MOBIC) 15 MG tablet; Take 1 tablet (15 mg total) by mouth daily.  Numbness and tingling of right arm -     DG Cervical Spine Complete; Future -     DG Shoulder Right; Future -     meloxicam (MOBIC) 15 MG tablet; Take 1 tablet (15 mg total) by mouth daily.  Numbness and tingling in right hand -     DG Cervical Spine Complete; Future -     DG Shoulder Right; Future -     meloxicam (MOBIC) 15 MG tablet; Take 1 tablet (15 mg total) by mouth daily.   Suspect shoulder bursitis Will get cervical spine and right shoulder xray today Subacromial injection given today Start mobic daily Exercises printed and given to start at home ? Carpal tunnel as well Follow with sports medicine if not improving     Return in about 2 weeks (around 06/28/2021) for dr. Harvie Guerrero sports medicine right shoulder and arm pain  follow up.  Trevor Gaw, PA-C

## 2021-06-14 NOTE — Patient Instructions (Signed)
Start mobic daily for next 2 weeks with food Follow up with Dr. Karie Schwalbe, sports medicine

## 2021-06-17 ENCOUNTER — Encounter: Payer: Self-pay | Admitting: Physician Assistant

## 2021-06-17 DIAGNOSIS — M5030 Other cervical disc degeneration, unspecified cervical region: Secondary | ICD-10-CM | POA: Insufficient documentation

## 2021-06-17 NOTE — Progress Notes (Signed)
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

## 2021-06-17 NOTE — Progress Notes (Signed)
No acute findings. You do have some narrowing of the joint in right shoulder but no significant arthritis.

## 2021-06-19 ENCOUNTER — Telehealth: Payer: Self-pay | Admitting: Neurology

## 2021-06-19 DIAGNOSIS — M25511 Pain in right shoulder: Secondary | ICD-10-CM

## 2021-06-19 NOTE — Telephone Encounter (Signed)
Called patient and LVM for him to call back to discuss.

## 2021-06-19 NOTE — Telephone Encounter (Signed)
Suspect rotator cuff tendonitis vs inflammation of the bursa that lines the shoulder joint. You did have some mild joint space narrowing in the right shoulder.  Both of these usually respond to anti-inflammatories, rest, exercises and time.  Did the injection given in office help at all?  Are you taking the mobic?   You did have some numbness in hands that could be some carpal tunnel starting but due to shoulder tenderness I think most of your pain/symptoms is coming from your shoulder.

## 2021-06-19 NOTE — Progress Notes (Signed)
See patient calls to call patient with information.

## 2021-06-19 NOTE — Telephone Encounter (Signed)
Patient called and left vm stating he has not heard back about what could be causing pain. Please advise.     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

## 2021-06-21 NOTE — Telephone Encounter (Signed)
Placed a referral for physical therapy. Even one or two sessions could help.

## 2021-06-21 NOTE — Addendum Note (Signed)
Addended by: Jomarie Longs on: 06/21/2021 03:14 PM   Modules accepted: Orders

## 2021-06-21 NOTE — Telephone Encounter (Signed)
Patient made aware and is okay with referral.  

## 2021-06-27 ENCOUNTER — Encounter: Payer: Self-pay | Admitting: Rehabilitative and Restorative Service Providers"

## 2021-06-27 ENCOUNTER — Ambulatory Visit
Payer: Commercial Managed Care - PPO | Attending: Physician Assistant | Admitting: Rehabilitative and Restorative Service Providers"

## 2021-06-27 DIAGNOSIS — M25511 Pain in right shoulder: Secondary | ICD-10-CM

## 2021-06-27 DIAGNOSIS — R29898 Other symptoms and signs involving the musculoskeletal system: Secondary | ICD-10-CM

## 2021-06-27 DIAGNOSIS — M5412 Radiculopathy, cervical region: Secondary | ICD-10-CM | POA: Diagnosis present

## 2021-06-27 DIAGNOSIS — R293 Abnormal posture: Secondary | ICD-10-CM

## 2021-06-27 NOTE — Patient Instructions (Addendum)
Access Code: [email or web address] URL: [email or web address] Date: 06/27/2021 Prepared by: Corlis Leak  Exercises - Seated Cervical Retraction  - 3 x daily - 7 x weekly - 1 sets - 10 reps - Standing Scapular Retraction  - 3 x daily - 7 x weekly - 1 sets - 10 reps - 10 hold - Shoulder External Rotation and Scapular Retraction  - 3 x daily - 7 x weekly - 1 sets - 10 reps -   hold - Shoulder External Rotation in 45 Degrees Abduction  - 2 x daily - 7 x weekly - 1-2 sets - 10 reps - 3 sec  hold - Doorway Pec Stretch at 60 Degrees Abduction  - 3 x daily - 7 x weekly - 1 sets - 3 reps - Doorway Pec Stretch at 90 Degrees Abduction  - 3 x daily - 7 x weekly - 1 sets - 3 reps - 30 seconds  hold - Doorway Pec Stretch at 120 Degrees Abduction  - 3 x daily - 7 x weekly - 1 sets - 3 reps - 30 second hold  hold - Seated Hip Flexor Stretch  - 2 x daily - 7 x weekly - 1 sets - 3 reps - 30 sec  hold  Patient Education - TENS Unit - Trigger Point Dry Needling - Office Posture

## 2021-06-27 NOTE — Therapy (Signed)
[facility] [place] [address] [place] [address] [place], [place], [address] Phone: [phone]   Fax:  [phone]  Physical Therapy Evaluation  Rationale for Evaluation and Treatment Rehabilitation   Patient Details  Name: Trevor Guerrero MRN: [id number] Date of Birth: 01-31-1960 Referring Provider (PT): Tandy Gaw PA - C   Encounter Date: 06/27/2021   PT End of Session - 06/27/21 1049     Visit Number 1    Number of Visits 16    Date for PT Re-Evaluation 08/22/21    PT Start Time 0845    PT Stop Time 0936    PT Time Calculation (min) 51 min    Activity Tolerance Patient tolerated treatment well             Past Medical History:  Diagnosis Date   Erectile dysfunction 03/28/2014   Essential hypertension 02/28/2015   Family history of alpha 1 antitrypsin deficiency 12/07/2017   First degree AV block 05/18/2015   Normal 48 Hour Holter Monitor May 2017    Heterozygous alpha 1-antitrypsin deficiency (HCC) 12/17/2017   Level at 75.  Less than 57 is considered severe   Hyperlipidemia 11/16/2013   AHA 10 year risk of 9.0% Nov 2016    Solitary pulmonary nodule 11/24/2013   In addition, there is a non calcified right middle lobe nodule measuring 4-5 mm. This may reflect a noncalcified granuloma. No follow-up needed if patient is low-risk. Non-contrast chest CT can be considered in 12 months if patient is high-risk  Plan to repeat CT in 1 year    Past Surgical History:  Procedure Laterality Date   APPENDECTOMY     left testicular mass Left     There were no vitals filed for this visit.    Subjective Assessment - 06/27/21 0849     Subjective Patient reports that he has had a lot of pain in the Rt shoulder with pain running down into the Rt arm and fingers which has been present for the past 3 weeks with no known injury. He has had some neck pain in the past 3 weeks. X-rays show arthritis in his neck. he has some popping in his neck when  he turns his head while driving.    Pertinent History arthritis    Patient Stated Goals get rid of the pain    Currently in Pain? Yes    Pain Score 5     Pain Location Neck    Pain Orientation Right    Pain Descriptors / Indicators Nagging;Dull    Pain Type Acute pain    Pain Radiating Towards into Rt shoulder and arm to fingers    Pain Onset 1 to 4 weeks ago    Pain Frequency Intermittent    Aggravating Factors  sitting; reaching; driving; lifting    Pain Relieving Factors meds for a short period of time; placing arm between legs to straighten arm out or straightening arm out beside of the seat                [facility] PT Assessment - 06/27/21 0001       Assessment   Medical Diagnosis Rt shoulder pain; cervical radicular pain    Referring Provider (PT) Tandy Gaw PA - C    Onset Date/Surgical Date 06/06/21    Hand Dominance Right    Next MD Visit 08/08/21   Dr Ashley Royalty   Prior Therapy 15 yrs ago      Precautions   Precautions None  Restrictions   Weight Bearing Restrictions No      Balance Screen   Has the patient fallen in the past 6 months No    Has the patient had a decrease in activity level because of a fear of falling?  No    Is the patient reluctant to leave their home because of a fear of falling?  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; worked in a [employer]; Publishing rights manager on trucks x 20 yrs    Leisure yard work; household chores; Training and development officer      Observation/Other Assessments   Focus on Therapeutic Outcomes (FOTO)  50      Sensation   Additional Comments tingling and numbness in Rt fingers on an intermittent basis      Posture/Postural Control   Posture Comments head forward; shoulders rounded  and elevated; head of the humerus anterior in orientation; scapulae abducted and rotated along the thoracic wall      AROM   Right Shoulder Flexion 150 Degrees    Right Shoulder ABduction 155 Degrees    Right Shoulder External Rotation 70 Degrees    Left Shoulder Flexion 150 Degrees    Left Shoulder ABduction 155 Degrees    Left Shoulder External Rotation 70 Degrees    Cervical Flexion 48    Cervical Extension 22 popping    Cervical - Right Side Bend 26 tight and popping    Cervical - Left Side Bend 22 tight and popping    Cervical - Right Rotation 60 tight    Cervical - Left Rotation 62 tight      Strength   Overall Strength Comments WFL's      Palpation   Spinal mobility hypomobile thoracic and cervical PA and lateral mobs    Palpation comment muscular tightness ant/lat/post cervical musculature; pecs; upper trap; teres; leveator                        Objective measurements completed on examination: See above findings.       [facility] Adult PT Treatment/Exercise - 06/27/21 0001       Self-Care   ADL's use of noodle to improve sitting posture for driving      Therapeutic Activites    Other Therapeutic Activities myofacial ball release work thoracic spine and through pecs      Neuro Re-ed    Neuro Re-ed Details  working on posture and alignement - engaging posterior shoulder girdle musculature      Shoulder Exercises: Standing   Other Standing Exercises axial extension 5-10 sec x 5; scap squeeze 10 sec x 5 reps; L's x 10; W's x 10 with noodle along spine      Shoulder Exercises: Stretch   Other Shoulder Stretches doorway x 3 positions to pt tolerance 2 reps 20-30 sec hold      Moist Heat Therapy   Number Minutes Moist Heat 10 Minutes    Moist Heat Location Shoulder;Cervical      Electrical Stimulation   Electrical Stimulation Location Rt shoulder girdle    Electrical Stimulation Action TENS    Electrical Stimulation Parameters to tolerance     Electrical Stimulation Goals Pain;Tone  PT Education - 06/27/21 4166     Education Details POC HEP TENS DN posture    Person(s) Educated Patient    Methods Explanation;Demonstration;Tactile cues;Verbal cues;Handout    Comprehension Verbalized understanding;Returned demonstration;Verbal cues required;Tactile cues required                 PT Long Term Goals - 06/27/21 1055       PT LONG TERM GOAL #1   Title Improve posture and alignment with patient to demonstrated improved upright posture with posterior shoulder girdle engaged    Time 8    Period Weeks    Status New    Target Date 08/22/21      PT LONG TERM GOAL #2   Title Decrease pain in Rt upper quarter by 75-100% allowing patient to drive without increased Rt UE radicular symptoms    Time 8    Period Weeks    Status New    Target Date 08/22/21      PT LONG TERM GOAL #3   Title Increase postural strength with patient to demonstrate ability to hold head, chest, shoulders in upright position for functional activities    Time 8    Period Weeks    Status New    Target Date 08/22/21      PT LONG TERM GOAL #4   Title Independent in HEP    Time 8    Period Weeks    Status New    Target Date 08/22/21      PT LONG TERM GOAL #5   Title Improve functional limitation score to 73    Time 8    Period Weeks    Status New    Target Date 08/22/21                    Plan - 06/27/21 1051     Clinical Impression Statement Patient presents with ~ 2 weeks history of Rt shoulder and UE pain including tingling and numbness into the fingers on an intermittent basis. Patient has poor posture and alignment, limited and tight cervical and bilat shoulder ROM; muscular tightness to palpation; postural and muscular imbalance; poor postural strength and stability; forward orientation with work and ADL's; chronic postural changes; pain on a daily basis. Patient will benefit from PT to address  problems identified.    Stability/Clinical Decision Making Stable/Uncomplicated    Clinical Decision Making Low    Rehab Potential Good    PT Frequency 2x / week    PT Duration 8 weeks    PT Treatment/Interventions ADLs/Self Care Home Management;Aquatic Therapy;Cryotherapy;Electrical Stimulation;Iontophoresis 4mg /ml Dexamethasone;Moist Heat;Ultrasound;Therapeutic activities;Therapeutic exercise;Balance training;Neuromuscular re-education;Patient/family education;Manual techniques;Dry needling;Taping    PT Next Visit Plan review and progress with exercise program - stretch pecs; strengthening posterior shoulder girdle; DN vs manual work pecs/upper trap/cervical musculature; postural correction; modalities as indicated (son has TENS unit he may borrow)    PT Home Exercise Plan    Consulted and Agree with Plan of Care Patient             Patient will benefit from skilled therapeutic intervention in order to improve the following deficits and impairments:  Decreased range of motion, Decreased activity tolerance, Pain, Hypomobility, Impaired flexibility, Improper body mechanics, Decreased mobility, Decreased strength, Impaired sensation, Postural dysfunction  Visit Diagnosis: Acute pain of right shoulder  Radiculopathy, cervical region  Other symptoms and signs involving the musculoskeletal system  Abnormal posture     Problem List Patient Active Problem List  Diagnosis Date Noted   DDD (degenerative disc disease), cervical 06/17/2021   Numbness and tingling of right arm 06/14/2021   Acute pain of right shoulder 06/14/2021   Skin lesion 08/14/2020   Well adult exam 11/23/2019   Incomplete emptying of bladder 08/23/2019   Syncope 08/23/2019   GERD (gastroesophageal reflux disease) 02/09/2019   Benign prostatic hyperplasia with urinary frequency 10/27/2018   Heterozygous alpha 1-antitrypsin deficiency (HCC) 12/17/2017   Family history of alpha 1 antitrypsin deficiency  12/07/2017   Left knee pain 12/02/2016   Vitamin D deficiency 11/28/2015   First degree AV block 05/18/2015   Essential hypertension 02/28/2015   Erectile dysfunction 03/28/2014   Solitary pulmonary nodule 11/24/2013   History of epididymitis 11/24/2013   Hyperlipidemia 11/16/2013    Myya Meenach Rober Minion, PT, MPH  06/27/2021, 11:01 AM  [facility] [address] [place] [address] [place], [place], [address] Phone: [phone]   Fax:  [phone]  Name: Trevor Guerrero MRN: [id number] Date of Birth: 08-10-1960

## 2021-07-04 ENCOUNTER — Encounter: Payer: Self-pay | Admitting: Rehabilitative and Restorative Service Providers"

## 2021-07-04 ENCOUNTER — Ambulatory Visit: Payer: Commercial Managed Care - PPO | Admitting: Rehabilitative and Restorative Service Providers"

## 2021-07-04 DIAGNOSIS — M5412 Radiculopathy, cervical region: Secondary | ICD-10-CM

## 2021-07-04 DIAGNOSIS — M25511 Pain in right shoulder: Secondary | ICD-10-CM

## 2021-07-04 DIAGNOSIS — R29898 Other symptoms and signs involving the musculoskeletal system: Secondary | ICD-10-CM

## 2021-07-04 DIAGNOSIS — R293 Abnormal posture: Secondary | ICD-10-CM

## 2021-07-04 NOTE — Patient Instructions (Signed)
Access Code: [email or web address] URL: [email or web address] Date: 07/04/2021 Prepared by: Corlis Leak  Exercises - Seated Cervical Retraction  - 3 x daily - 7 x weekly - 1 sets - 10 reps - Standing Scapular Retraction  - 3 x daily - 7 x weekly - 1 sets - 10 reps - 10 hold - Shoulder External Rotation and Scapular Retraction  - 3 x daily - 7 x weekly - 1 sets - 10 reps -   hold - Doorway Pec Stretch at 60 Degrees Abduction  - 3 x daily - 7 x weekly - 1 sets - 3 reps - Doorway Pec Stretch at 90 Degrees Abduction  - 3 x daily - 7 x weekly - 1 sets - 3 reps - 30 seconds  hold - Doorway Pec Stretch at 120 Degrees Abduction  - 3 x daily - 7 x weekly - 1 sets - 3 reps - 30 second hold  hold - Seated Hip Flexor Stretch  - 2 x daily - 7 x weekly - 1 sets - 3 reps - 30 sec  hold - Shoulder External Rotation in 45 Degrees Abduction  - 2 x daily - 7 x weekly - 1-2 sets - 10 reps - 3 sec  hold - Seated Cervical Sidebending AROM  - 2 x daily - 7 x weekly - 1 sets - 5 reps - 5-10 sec  hold

## 2021-07-04 NOTE — Therapy (Addendum)
[facility] [place] [address] [place], [place], [address] Phone: [phone]   Fax:  [phone]  Physical Therapy Treatment  Rationale for Evaluation and Treatment Rehabilitation   Patient Details  Name: Trevor Guerrero MRN: [id number] Date of Birth: [birth year] Referring Provider (PT): Tandy Gaw PA - C   Encounter Date: 07/04/2021   PT End of Session - 07/04/21 0926     Visit Number 2    Number of Visits 16    Date for PT Re-Evaluation 08/22/21    PT Start Time 0926    PT Stop Time 1015    PT Time Calculation (min) 49 min    Activity Tolerance Patient tolerated treatment well             Past Medical History:  Diagnosis Date   Erectile dysfunction 03/28/2014   Essential hypertension 02/28/2015   Family history of alpha 1 antitrypsin deficiency 12/07/2017   First degree AV block 05/18/2015   Normal 48 Hour Holter Monitor May 2017    Heterozygous alpha 1-antitrypsin deficiency (HCC) 12/17/2017   Level at 75.  Less than 57 is considered severe   Hyperlipidemia 11/16/2013   AHA 10 year risk of 9.0% Nov 2016    Solitary pulmonary nodule 11/24/2013   In addition, there is a non calcified right middle lobe nodule measuring 4-5 mm. This may reflect a noncalcified granuloma. No follow-up needed if patient is low-risk. Non-contrast chest CT can be considered in 12 months if patient is high-risk  Plan to repeat CT in 1 year    Past Surgical History:  Procedure Laterality Date   APPENDECTOMY     left testicular mass Left     There were no vitals filed for this visit.   Subjective Assessment - 07/04/21 0927     Subjective Patient reports that he has been working on exercises. He has trouble with the one where his leg is down(hip flexor stretch) and has not done this one. The symptoms in the arm are not as much in the shoulder but continues to have symptoms in the elbow. Pain starts about noontime and is worse by the end  of the day.    Currently in Pain? No/denies    Pain Score 0-No pain    Pain Location Neck    Pain Orientation Right                               [facility] Adult PT Treatment/Exercise - 07/04/21 0001       Neuro Re-ed    Neuro Re-ed Details  working on posture and alignement - engaging posterior shoulder girdle musculature      Neck Exercises: Seated   Neck Retraction 5 reps;10 secs    Lateral Flexion Right;Left;5 reps    Lateral Flexion Limitations 5 sec hold      Shoulder Exercises: Standing   Other Standing Exercises axial extension 5-10 sec x 5; scap squeeze 10 sec x 5 reps; L's x 10; W's x 10 with noodle along spine      Shoulder Exercises: Stretch   Other Shoulder Stretches doorway x 3 positions to pt tolerance 2 reps 20-30 sec hold    Other Shoulder Stretches upper trap stretch reaching for floor 10 sec hold x 5 reps      Moist Heat Therapy   Number Minutes Moist Heat 10 Minutes    Moist Heat Location Shoulder;Cervical  Acupuncturist Location Rt cervical and upper trap musculature    Electrical Stimulation Action TENS    Electrical Stimulation Parameters to tolerance    Electrical Stimulation Goals Pain;Tone      Manual Therapy   Manual therapy comments skilled palpation to assess response to DN and manual work    Joint Mobilization thoracic and cervical PA and lateral mobs Grade II/III    Soft tissue mobilization deep tissue work through Engineer, materials Point Dry Needling - 07/04/21 0001     Consent Given? Yes    Education Handout Provided Previously provided    Muscles Treated Head and Neck Upper trapezius;Oblique capitus;Suboccipitals;Scalenes;Cervical multifidi    Other Dry Needling bilat    Upper Trapezius Response Palpable increased muscle length    Oblique Capitus Response Palpable increased muscle length    Suboccipitals Response Palpable increased muscle  length    Scalenes Response Palpable increased muscle length    Cervical multifidi Response Palpable increased muscle length                   PT Education - 07/04/21 1028     Education Details HEP    Person(s) Educated Patient    Methods Explanation;Demonstration;Tactile cues;Verbal cues;Handout    Comprehension Verbalized understanding;Returned demonstration;Verbal cues required;Tactile cues required                 PT Long Term Goals - 06/27/21 1055       PT LONG TERM GOAL #1   Title Improve posture and alignment with patient to demonstrated improved upright posture with posterior shoulder girdle engaged    Time 8    Period Weeks    Status New    Target Date 08/22/21      PT LONG TERM GOAL #2   Title Decrease pain in Rt upper quarter by 75-100% allowing patient to drive without increased Rt UE radicular symptoms    Time 8    Period Weeks    Status New    Target Date 08/22/21      PT LONG TERM GOAL #3   Title Increase postural strength with patient to demonstrate ability to hold head, chest, shoulders in upright position for functional activities    Time 8    Period Weeks    Status New    Target Date 08/22/21      PT LONG TERM GOAL #4   Title Independent in HEP    Time 8    Period Weeks    Status New    Target Date 08/22/21      PT LONG TERM GOAL #5   Title Improve functional limitation score to 73    Time 8    Period Weeks    Status New    Target Date 08/22/21                   Plan - 07/04/21 0929     Clinical Impression Statement Patient has continued head forward posture with shoudlers rounded. Reviewed and corrected HEP. Trial of DN and manual work for cervical and upper trap musculature with good response - decreased palpable tightness and increased cervical mobility. Continued education re- posture and alignment.    Rehab Potential Good    PT Frequency 2x / week    PT Duration 8 weeks    PT Treatment/Interventions  ADLs/Self Care Home Management;Aquatic  Therapy;Cryotherapy;Electrical Stimulation;Iontophoresis 4mg /ml Dexamethasone;Moist Heat;Ultrasound;Therapeutic activities;Therapeutic exercise;Balance training;Neuromuscular re-education;Patient/family education;Manual techniques;Dry needling;Taping    PT Next Visit Plan review and progress with exercise program - stretch pecs; strengthening posterior shoulder girdle; DN vs manual work pecs/upper trap/cervical musculature; postural correction; modalities as indicated (son has TENS unit he may borrow)    PT Home Exercise Plan    Consulted and Agree with Plan of Care Patient             Patient will benefit from skilled therapeutic intervention in order to improve the following deficits and impairments:     Visit Diagnosis: Acute pain of right shoulder  Radiculopathy, cervical region  Other symptoms and signs involving the musculoskeletal system  Abnormal posture     Problem List Patient Active Problem List   Diagnosis Date Noted   DDD (degenerative disc disease), cervical 06/17/2021   Numbness and tingling of right arm 06/14/2021   Acute pain of right shoulder 06/14/2021   Skin lesion 08/14/2020   Well adult exam 11/23/2019   Incomplete emptying of bladder 08/23/2019   Syncope 08/23/2019   GERD (gastroesophageal reflux disease) 02/09/2019   Benign prostatic hyperplasia with urinary frequency 10/27/2018   Heterozygous alpha 1-antitrypsin deficiency (HCC) 12/17/2017   Family history of alpha 1 antitrypsin deficiency 12/07/2017   Left knee pain 12/02/2016   Vitamin D deficiency 11/28/2015   First degree AV block 05/18/2015   Essential hypertension 02/28/2015   Erectile dysfunction 03/28/2014   Solitary pulmonary nodule 11/24/2013   History of epididymitis 11/24/2013   Hyperlipidemia 11/16/2013    Yanina Knupp 13/11/2013, PT, MPH  07/04/2021, 10:29 AM  [facility] [address] [place] [address]  [address] [place], [place], [place] Phone: [phone]   Fax:  [phone]  Name: [place] MRN: Orvan Seen Date of Birth: Dec 18, 1960

## 2021-07-08 ENCOUNTER — Encounter: Payer: Self-pay | Admitting: Rehabilitative and Restorative Service Providers"

## 2021-07-08 ENCOUNTER — Ambulatory Visit
Payer: Commercial Managed Care - PPO | Attending: Physician Assistant | Admitting: Rehabilitative and Restorative Service Providers"

## 2021-07-08 DIAGNOSIS — M5412 Radiculopathy, cervical region: Secondary | ICD-10-CM | POA: Diagnosis present

## 2021-07-08 DIAGNOSIS — M25511 Pain in right shoulder: Secondary | ICD-10-CM | POA: Insufficient documentation

## 2021-07-08 DIAGNOSIS — R293 Abnormal posture: Secondary | ICD-10-CM | POA: Diagnosis present

## 2021-07-08 DIAGNOSIS — R29898 Other symptoms and signs involving the musculoskeletal system: Secondary | ICD-10-CM | POA: Diagnosis present

## 2021-07-08 NOTE — Patient Instructions (Signed)
Access Code: [email or web address] URL: [email or web address] Date: 07/08/2021 Prepared by: Corlis Leak  Exercises - Seated Cervical Retraction  - 3 x daily - 7 x weekly - 1 sets - 10 reps - Standing Scapular Retraction  - 3 x daily - 7 x weekly - 1 sets - 10 reps - 10 hold - Shoulder External Rotation and Scapular Retraction  - 3 x daily - 7 x weekly - 1 sets - 10 reps -   hold - Doorway Pec Stretch at 60 Degrees Abduction  - 3 x daily - 7 x weekly - 1 sets - 3 reps - Doorway Pec Stretch at 90 Degrees Abduction  - 3 x daily - 7 x weekly - 1 sets - 3 reps - 30 seconds  hold - Doorway Pec Stretch at 120 Degrees Abduction  - 3 x daily - 7 x weekly - 1 sets - 3 reps - 30 second hold  hold - Seated Hip Flexor Stretch  - 2 x daily - 7 x weekly - 1 sets - 3 reps - 30 sec  hold - Shoulder External Rotation in 45 Degrees Abduction  - 2 x daily - 7 x weekly - 1-2 sets - 10 reps - 3 sec  hold - Seated Cervical Sidebending AROM  - 2 x daily - 7 x weekly - 1 sets - 5 reps - 5-10 sec  hold - Shoulder External Rotation and Scapular Retraction with Resistance  - 2 x daily - 7 x weekly - 3 sets - 10 reps - Scapular Retraction with Resistance  - 2 x daily - 7 x weekly - 3 sets - 10 reps - Drawing Bow  - 1 x daily - 7 x weekly - 1 sets - 10 reps - 3 sec  hold

## 2021-07-08 NOTE — Therapy (Signed)
[facility] [place], [place], [address] Phone: [phone]   Fax:  [phone]  Physical Therapy Treatment Rationale for Evaluation and Treatment Rehabilitation  Patient Details  Name: Trevor Guerrero MRN: [id number] Date of Birth: September 07, 1960 Referring Provider (PT): Iran Planas PA - C   Encounter Date: 07/08/2021   PT End of Session - 07/08/21 0851     Visit Number 3    Number of Visits 16    Date for PT Re-Evaluation 08/22/21    PT Start Time [id number]    PT Stop Time 0937    PT Time Calculation (min) 48 min    Activity Tolerance Patient tolerated treatment well             Past Medical History:  Diagnosis Date   Erectile dysfunction 03/28/2014   Essential hypertension 02/28/2015   Family history of alpha 1 antitrypsin deficiency 12/07/2017   First degree AV block 05/18/2015   Normal 48 Hour Holter Monitor May 2017    Heterozygous alpha 1-antitrypsin deficiency ([place]) 12/17/2017   Level at 50.  Less than 57 is considered severe   Hyperlipidemia 11/16/2013   AHA 10 year risk of 9.0% Nov 2016    Solitary pulmonary nodule 11/24/2013   In addition, there is a non calcified right middle lobe nodule measuring 4-5 mm. This may reflect a noncalcified granuloma. No follow-up needed if patient is low-risk. Non-contrast chest CT can be considered in 12 months if patient is high-risk  Plan to repeat CT in 1 year    Past Surgical History:  Procedure Laterality Date   APPENDECTOMY     left testicular mass Left     There were no vitals filed for this visit.   Subjective Assessment - 07/08/21 0852     Subjective Patient reports thathe is not having pain in the the Rt arm now. Has not hurt since last visit on Thursday. Still has some tightness in the base of the skull/head area. Working on the exercises and trying to change driving position.has adjusted the seat in the truck.    Currently in Pain? No/denies    Pain  Score 0-No pain    Pain Location Neck    Pain Orientation Right    Pain Descriptors / Indicators Tightness                [facility] PT Assessment - 07/08/21 0001       Assessment   Medical Diagnosis Rt shoulder pain; cervical radicular pain    Referring Provider (PT) Iran Planas PA - C    Onset Date/Surgical Date 06/06/21    Hand Dominance Right    Next MD Visit 08/08/21   Dr Zigmund Daniel   Prior Therapy 15 yrs ago      Sensation   Additional Comments resolution of all tingling and numbness in Rt fingers on an intermittent basis      Posture/Postural Control   Posture Comments head forward; shoulders rounded and elevated; head of the humerus anterior in orientation; scapulae abducted and rotated along the thoracic wall      Palpation   Spinal mobility hypomobile thoracic and cervical PA and lateral mobs    Palpation comment muscular tightness ant/lat/post cervical musculature; pecs; upper trap; teres; leveator                           [facility] Adult PT Treatment/Exercise - 07/08/21 0001  Therapeutic Activites    Other Therapeutic Activities myofacial ball release golf balls at occipital area bilat      Neuro Re-ed    Neuro Re-ed Details  working on posture and alignement - engaging posterior shoulder girdle musculature      Neck Exercises: Seated   Neck Retraction 5 reps;10 secs    Lateral Flexion Right;Left;5 reps    Lateral Flexion Limitations 5 sec hold    Other Seated Exercise thoracic extension on coregeous ball 30 sec x 2 reps arms crossed over chest and arms overhead      Shoulder Exercises: Standing   Row Strengthening;Both;10 reps;Theraband    Theraband Level (Shoulder Row) Level 4 (Blue)    Row Limitations bow and arrow 10 reps each side blue TB    Retraction Strengthening;Both;10 reps;Theraband    Theraband Level (Shoulder Retraction) Level 2 (Red)    Other Standing Exercises axial extension 5-10 sec x 5; scap squeeze 10 sec x 5 reps; L's  x 10; W's x 10 with noodle along spine      Shoulder Exercises: Stretch   Other Shoulder Stretches doorway x 3 positions to pt tolerance 2 reps 20-30 sec hold    Other Shoulder Stretches upper trap stretch reaching for floor 10 sec hold x 5 reps      Manual Therapy   Manual therapy comments skilled palpation to assess response to DN and manual work    Joint Mobilization thoracic and cervical PA and lateral mobs Grade II/III    Soft tissue mobilization deep tissue work through Ambulance person [place] Dry Needling - 07/08/21 0001     Consent Given? Yes    Education Handout Provided Previously provided    Other Dry Needling bilat    Oblique Capitus Response Palpable increased muscle length    Suboccipitals Response Palpable increased muscle length    Scalenes Response Palpable increased muscle length    Cervical multifidi Response Palpable increased muscle length                   PT Education - 07/08/21 0902     Education Details HEP    Person(s) Educated Patient    Methods Explanation;Demonstration;Tactile cues;Verbal cues;Handout    Comprehension Verbalized understanding;Returned demonstration;Verbal cues required;Tactile cues required                 PT Long Term Goals - 06/27/21 1055       PT LONG TERM GOAL #1   Title Improve posture and alignment with patient to demonstrated improved upright posture with posterior shoulder girdle engaged    Time 8    Period Weeks    Status New    Target Date 08/22/21      PT LONG TERM GOAL #2   Title Decrease pain in Rt upper quarter by 75-100% allowing patient to drive without increased Rt UE radicular symptoms    Time 8    Period Weeks    Status New    Target Date 08/22/21      PT LONG TERM GOAL #3   Title Increase postural strength with patient to demonstrate ability to hold head, chest, shoulders in upright position for functional activities    Time 8    Period Weeks     Status New    Target Date 08/22/21      PT LONG TERM GOAL #4   Title Independent  in HEP    Time 8    Period Weeks    Status New    Target Date 08/22/21      PT LONG TERM GOAL #5   Title Improve functional limitation score to 73    Time 8    Period Weeks    Status New    Target Date 08/22/21                   Plan - 07/08/21 0858     Clinical Impression Statement Excellent progress with treatment, HEP and postural correction. Added posterior shoulder girdle strengthening. Continued with DN/manual work.    Rehab Potential Good    PT Frequency 2x / week    PT Duration 8 weeks    PT Treatment/Interventions ADLs/Self Care Home Management;Aquatic Therapy;Cryotherapy;Electrical Stimulation;Iontophoresis 4mg /ml Dexamethasone;Moist Heat;Ultrasound;Therapeutic activities;Therapeutic exercise;Balance training;Neuromuscular re-education;Patient/family education;Manual techniques;Dry needling;Taping    PT Next Visit Plan review and progress with exercise program - stretch pecs; strengthening posterior shoulder girdle; DN vs manual work pecs/upper trap/cervical musculature; postural correction; modalities as indicated (son has TENS unit he may borrow)    PT Home Exercise Plan    Consulted and Agree with Plan of Care Patient             Patient will benefit from skilled therapeutic intervention in order to improve the following deficits and impairments:     Visit Diagnosis: Acute pain of right shoulder  Radiculopathy, cervical region  Other symptoms and signs involving the musculoskeletal system  Abnormal posture     Problem List Patient Active Problem List   Diagnosis Date Noted   DDD (degenerative disc disease), cervical 06/17/2021   Numbness and tingling of right arm 06/14/2021   Acute pain of right shoulder 06/14/2021   Skin lesion 08/14/2020   Well adult exam 11/23/2019   Incomplete emptying of bladder 08/23/2019   Syncope 08/23/2019   GERD  (gastroesophageal reflux disease) 02/09/2019   Benign prostatic hyperplasia with urinary frequency 10/27/2018   Heterozygous alpha 1-antitrypsin deficiency ([facility]) 12/17/2017   Family history of alpha 1 antitrypsin deficiency 12/07/2017   Left knee pain 12/02/2016   Vitamin D deficiency 11/28/2015   First degree AV block 05/18/2015   Essential hypertension 02/28/2015   Erectile dysfunction 03/28/2014   Solitary pulmonary nodule 11/24/2013   History of epididymitis 11/24/2013   Hyperlipidemia 11/16/2013    Willis Holquin 13/11/2013, PT, MPH  07/08/2021, 9:33 AM  [facility] [address] [place], [place], [place] Phone: [phone]   Fax:  [phone]  Name: Trevor Guerrero MRN: Orvan Seen Date of Birth: Jul 15, 1960

## 2021-07-11 ENCOUNTER — Ambulatory Visit: Payer: Commercial Managed Care - PPO | Admitting: Rehabilitative and Restorative Service Providers"

## 2021-07-11 ENCOUNTER — Encounter: Payer: Self-pay | Admitting: Rehabilitative and Restorative Service Providers"

## 2021-07-11 DIAGNOSIS — R29898 Other symptoms and signs involving the musculoskeletal system: Secondary | ICD-10-CM

## 2021-07-11 DIAGNOSIS — M25511 Pain in right shoulder: Secondary | ICD-10-CM

## 2021-07-11 DIAGNOSIS — M5412 Radiculopathy, cervical region: Secondary | ICD-10-CM

## 2021-07-11 DIAGNOSIS — R293 Abnormal posture: Secondary | ICD-10-CM

## 2021-07-11 NOTE — Therapy (Signed)
[facility] [place] [address] [place], [place], [address] Phone: [phone]   Fax:  [phone]  Physical Therapy Treatment  Rationale for Evaluation and Treatment Rehabilitation   Patient Details  Name: Trevor Guerrero MRN: [id number] Date of Birth: March 16, 1960 Referring Provider (PT): Tandy Gaw PA - C   Encounter Date: 07/11/2021   PT End of Session - 07/11/21 0839     Visit Number 4    Number of Visits 16    Date for PT Re-Evaluation 08/22/21    PT Start Time 0839    PT Stop Time 0929    PT Time Calculation (min) 50 min    Activity Tolerance Patient tolerated treatment well             Past Medical History:  Diagnosis Date   Erectile dysfunction 03/28/2014   Essential hypertension 02/28/2015   Family history of alpha 1 antitrypsin deficiency 12/07/2017   First degree AV block 05/18/2015   Normal 48 Hour Holter Monitor May 2017    Heterozygous alpha 1-antitrypsin deficiency ([facility]) 12/17/2017   Level at 75.  Less than 57 is considered severe   Hyperlipidemia 11/16/2013   AHA 10 year risk of 9.0% Nov 2016    Solitary pulmonary nodule 11/24/2013   In addition, there is a non calcified right middle lobe nodule measuring 4-5 mm. This may reflect a noncalcified granuloma. No follow-up needed if patient is low-risk. Non-contrast chest CT can be considered in 12 months if patient is high-risk  Plan to repeat CT in 1 year    Past Surgical History:  Procedure Laterality Date   APPENDECTOMY     left testicular mass Left     There were no vitals filed for this visit.   Subjective Assessment - 07/11/21 0844     Subjective Patient reports that he had a little bit of soreness for a day after last visit but no pain and not symptoms in the Rt UE. Working on his exercises at home. 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    Currently in Pain? No/denies    Pain Score 0-No pain    Pain Location Neck     Pain Orientation Right    Pain Descriptors / Indicators Tightness                               [facility] Adult PT Treatment/Exercise - 07/11/21 0001       Posture/Postural Control   Posture Comments head forward; shoulders rounded and elevated; head of the humerus anterior in orientation; scapulae abducted and rotated along the thoracic wall      Neuro Re-ed    Neuro Re-ed Details  working on posture and alignement - engaging posterior shoulder girdle musculature      Neck Exercises: Seated   Neck Retraction 5 reps;10 secs    Lateral Flexion Right;Left;5 reps    Lateral Flexion Limitations 5 sec hold      Shoulder Exercises: Standing   Extension Limitations lat pull blue TB x 10 reps    Row Strengthening;Both;10 reps;Theraband    Theraband Level (Shoulder Row) Level 4 (Blue)    Row Limitations bow and arrow 10 reps each side blue TB    Retraction Strengthening;Both;10 reps;Theraband    Theraband Level (Shoulder Retraction) Level 2 (Red)    Other Standing Exercises axial extension 5-10 sec x 5; scap squeeze 10 sec x 5  reps; L's x 10; W's x 10 with noodle along spine      Shoulder Exercises: Stretch   Star Gazer Stretch 2 reps;30 seconds   T at wall   Other Shoulder Stretches doorway x 3 positions to pt tolerance 2 reps 20-30 sec hold    Other Shoulder Stretches upper trap stretch reaching for floor 10 sec hold x 5 reps      Moist Heat Therapy   Number Minutes Moist Heat 10 Minutes    Moist Heat Location Shoulder;Cervical      Manual Therapy   Manual therapy comments skilled palpation to assess response to DN and manual work    Joint Mobilization thoracic and cervical PA and lateral mobs Grade II/III    Soft tissue mobilization deep tissue work through Landscape architect and Ship broker [place] Dry Needling - 07/11/21 0001     Consent Given? Yes    Education Handout Provided Previously provided    Scalenes Response Palpable increased  muscle length    Pectoralis Major Response Palpable increased muscle length    Pectoralis Minor Response Palpable increased muscle length                   PT Education - 07/11/21 0908     Education Details HEP    Person(s) Educated Patient    Methods Explanation;Demonstration;Tactile cues;Verbal cues;Handout    Comprehension Verbalized understanding;Returned demonstration;Verbal cues required;Tactile cues required                 PT Long Term Goals - 06/27/21 1055       PT LONG TERM GOAL #1   Title Improve posture and alignment with patient to demonstrated improved upright posture with posterior shoulder girdle engaged    Time 8    Period Weeks    Status New    Target Date 08/22/21      PT LONG TERM GOAL #2   Title Decrease pain in Rt upper quarter by 75-100% allowing patient to drive without increased Rt UE radicular symptoms    Time 8    Period Weeks    Status New    Target Date 08/22/21      PT LONG TERM GOAL #3   Title Increase postural strength with patient to demonstrate ability to hold head, chest, shoulders in upright position for functional activities    Time 8    Period Weeks    Status New    Target Date 08/22/21      PT LONG TERM GOAL #4   Title Independent in HEP    Time 8    Period Weeks    Status New    Target Date 08/22/21      PT LONG TERM GOAL #5   Title Improve functional limitation score to 73    Time 8    Period Weeks    Status New    Target Date 08/22/21                   Plan - 07/11/21 0841     Clinical Impression Statement Excellent progress with resolution of all UE symptoms. No pain. Working on posture and alignment. Progressing with strengthening.    Rehab Potential Good    PT Frequency 2x / week    PT Duration 8 weeks    PT Treatment/Interventions ADLs/Self Care Home Management;Aquatic Therapy;Cryotherapy;Electrical Stimulation;Iontophoresis 4mg /ml Dexamethasone;Moist Heat;Ultrasound;Therapeutic  activities;Therapeutic exercise;Balance  training;Neuromuscular re-education;Patient/family education;Manual techniques;Dry needling;Taping    PT Next Visit Plan review and progress with exercise program - stretch pecs; strengthening posterior shoulder girdle; DN vs manual work pecs/upper trap/cervical musculature; postural correction; modalities as indicated (son has TENS unit he may borrow)    PT Home Exercise Plan [id number]    Consulted and Agree with Plan of Care Patient             Patient will benefit from skilled therapeutic intervention in order to improve the following deficits and impairments:     Visit Diagnosis: Acute pain of right shoulder  Radiculopathy, cervical region  Other symptoms and signs involving the musculoskeletal system  Abnormal posture     Problem List Patient Active Problem List   Diagnosis Date Noted   DDD (degenerative disc disease), cervical 06/17/2021   Numbness and tingling of right arm 06/14/2021   Acute pain of right shoulder 06/14/2021   Skin lesion 08/14/2020   Well adult exam 11/23/2019   Incomplete emptying of bladder 08/23/2019   Syncope 08/23/2019   GERD (gastroesophageal reflux disease) 02/09/2019   Benign prostatic hyperplasia with urinary frequency 10/27/2018   Heterozygous alpha 1-antitrypsin deficiency ([facility]) 12/17/2017   Family history of alpha 1 antitrypsin deficiency 12/07/2017   Left knee pain 12/02/2016   Vitamin D deficiency 11/28/2015   First degree AV block 05/18/2015   Essential hypertension 02/28/2015   Erectile dysfunction 03/28/2014   Solitary pulmonary nodule 11/24/2013   History of epididymitis 11/24/2013   Hyperlipidemia 11/16/2013    Alucard Fearnow Rober Minion, PT, MPH  07/11/2021, 9:29 AM  [facility] [address] [place], [place], [address] Phone: [phone]   Fax:  [phone]  Name: Gaelan Glennon MRN: [id number] Date of Birth: Dec 21, 1960

## 2021-07-11 NOTE — Patient Instructions (Signed)
Access Code: [email or web address] URL: [email or web address] Date: 07/11/2021 Prepared by: Corlis Leak  Exercises - Seated Cervical Retraction  - 3 x daily - 7 x weekly - 1 sets - 10 reps - Standing Scapular Retraction  - 3 x daily - 7 x weekly - 1 sets - 10 reps - 10 hold - Shoulder External Rotation and Scapular Retraction  - 3 x daily - 7 x weekly - 1 sets - 10 reps -   hold - Doorway Pec Stretch at 60 Degrees Abduction  - 3 x daily - 7 x weekly - 1 sets - 3 reps - Doorway Pec Stretch at 90 Degrees Abduction  - 3 x daily - 7 x weekly - 1 sets - 3 reps - 30 seconds  hold - Doorway Pec Stretch at 120 Degrees Abduction  - 3 x daily - 7 x weekly - 1 sets - 3 reps - 30 second hold  hold - Seated Hip Flexor Stretch  - 2 x daily - 7 x weekly - 1 sets - 3 reps - 30 sec  hold - Shoulder External Rotation in 45 Degrees Abduction  - 2 x daily - 7 x weekly - 1-2 sets - 10 reps - 3 sec  hold - Seated Cervical Sidebending AROM  - 2 x daily - 7 x weekly - 1 sets - 5 reps - 5-10 sec  hold - Shoulder External Rotation and Scapular Retraction with Resistance  - 2 x daily - 7 x weekly - 3 sets - 10 reps - Scapular Retraction with Resistance  - 2 x daily - 7 x weekly - 3 sets - 10 reps - Drawing Bow  - 1 x daily - 7 x weekly - 1 sets - 10 reps - 3 sec  hold - Standing Lat Pull Down with Resistance - Elbows Bent  - 2 x daily - 7 x weekly - 1 sets - 10 reps - 3 sec  hold - Standing Shoulder External Rotation Stretch at Wall  - 2 x daily - 7 x weekly - 1 sets - 3 reps - 30 sec  hold

## 2021-07-15 ENCOUNTER — Encounter: Payer: Self-pay | Admitting: Rehabilitative and Restorative Service Providers"

## 2021-07-15 ENCOUNTER — Ambulatory Visit: Payer: Commercial Managed Care - PPO | Admitting: Rehabilitative and Restorative Service Providers"

## 2021-07-15 DIAGNOSIS — R29898 Other symptoms and signs involving the musculoskeletal system: Secondary | ICD-10-CM

## 2021-07-15 DIAGNOSIS — M5412 Radiculopathy, cervical region: Secondary | ICD-10-CM

## 2021-07-15 DIAGNOSIS — R293 Abnormal posture: Secondary | ICD-10-CM

## 2021-07-15 DIAGNOSIS — M25511 Pain in right shoulder: Secondary | ICD-10-CM | POA: Diagnosis not present

## 2021-07-15 NOTE — Patient Instructions (Signed)
Access Code: [email or web address] URL: [email or web address] Date: 07/15/2021 Prepared by: Corlis Leak  Exercises - Seated Cervical Retraction  - 3 x daily - 7 x weekly - 1 sets - 10 reps - Standing Scapular Retraction  - 3 x daily - 7 x weekly - 1 sets - 10 reps - 10 hold - Shoulder External Rotation and Scapular Retraction  - 3 x daily - 7 x weekly - 1 sets - 10 reps -   hold - Doorway Pec Stretch at 60 Degrees Abduction  - 3 x daily - 7 x weekly - 1 sets - 3 reps - Doorway Pec Stretch at 90 Degrees Abduction  - 3 x daily - 7 x weekly - 1 sets - 3 reps - 30 seconds  hold - Doorway Pec Stretch at 120 Degrees Abduction  - 3 x daily - 7 x weekly - 1 sets - 3 reps - 30 second hold  hold - Seated Hip Flexor Stretch  - 2 x daily - 7 x weekly - 1 sets - 3 reps - 30 sec  hold - Shoulder External Rotation in 45 Degrees Abduction  - 2 x daily - 7 x weekly - 1-2 sets - 10 reps - 3 sec  hold - Seated Cervical Sidebending AROM  - 2 x daily - 7 x weekly - 1 sets - 5 reps - 5-10 sec  hold - Shoulder External Rotation and Scapular Retraction with Resistance  - 2 x daily - 7 x weekly - 3 sets - 10 reps - Scapular Retraction with Resistance  - 2 x daily - 7 x weekly - 3 sets - 10 reps - Drawing Bow  - 1 x daily - 7 x weekly - 1 sets - 10 reps - 3 sec  hold - Standing Lat Pull Down with Resistance - Elbows Bent  - 2 x daily - 7 x weekly - 1 sets - 10 reps - 3 sec  hold - Standing Shoulder External Rotation Stretch at Wall  - 2 x daily - 7 x weekly - 1 sets - 3 reps - 30 sec  hold - Standing Bent Over Shoulder Row with Resistance  - 1 x daily - 7 x weekly - 2-3 sets - 10 reps - 3 sec  hold - Seated Thoracic Extension Arms Overhead  - 1 x daily - 7 x weekly - 1 sets - 3 reps - 10sec  hold - Seated Thoracic Extension and Rotation with Reach  - 2 x daily - 7 x weekly - 1 sets - 3 reps - 10 sec  hold

## 2021-07-15 NOTE — Therapy (Signed)
[facility] [place] [address] [place] [address] [place], [place], [address] Phone: [phone]   Fax:  [phone]  Physical Therapy Treatment  Rationale for Evaluation and Treatment Rehabilitation  Patient Details  Name: Trevor Guerrero MRN: [id number] Date of Birth: 1960-12-28 Referring Provider (PT): Tandy Gaw PA - C   Encounter Date: 07/15/2021   PT End of Session - 07/15/21 0943     Visit Number 5    Number of Visits 16    Date for PT Re-Evaluation 08/22/21    PT Start Time 0930    PT Stop Time 1018    PT Time Calculation (min) 48 min    Activity Tolerance Patient tolerated treatment well             Past Medical History:  Diagnosis Date   Erectile dysfunction 03/28/2014   Essential hypertension 02/28/2015   Family history of alpha 1 antitrypsin deficiency 12/07/2017   First degree AV block 05/18/2015   Normal 48 Hour Holter Monitor May 2017    Heterozygous alpha 1-antitrypsin deficiency (HCC) 12/17/2017   Level at 75.  Less than 57 is considered severe   Hyperlipidemia 11/16/2013   AHA 10 year risk of 9.0% Nov 2016    Solitary pulmonary nodule 11/24/2013   In addition, there is a non calcified right middle lobe nodule measuring 4-5 mm. This may reflect a noncalcified granuloma. No follow-up needed if patient is low-risk. Non-contrast chest CT can be considered in 12 months if patient is high-risk  Plan to repeat CT in 1 year    Past Surgical History:  Procedure Laterality Date   APPENDECTOMY     left testicular mass Left     There were no vitals filed for this visit.   Subjective Assessment - 07/15/21 0943     Subjective No pain in neck or shoulder. Played the guitar for 7 hours without stopping this weekend. Working on posture and HEP.    Currently in Pain? No/denies    Pain Score 0-No pain    Pain Location Neck                [facility] PT Assessment - 07/15/21 0001       Assessment   Medical Diagnosis Rt  shoulder pain; cervical radicular pain    Referring Provider (PT) Tandy Gaw PA - C    Onset Date/Surgical Date 06/06/21    Hand Dominance Right    Next MD Visit 08/08/21   Dr Ashley Royalty   Prior Therapy 15 yrs ago      Sensation   Additional Comments resolution of all tingling and numbness in Rt fingers on an intermittent basis      Posture/Postural Control   Posture Comments head forward; shoulders rounded and elevated; head of the humerus anterior in orientation; scapulae abducted and rotated along the thoracic wall      Palpation   Spinal mobility hypomobile thoracic and cervical PA and lateral mobs    Palpation comment muscular tightness ant/lat/post cervical musculature; pecs; upper trap; teres; leveator                           [facility] Adult PT Treatment/Exercise - 07/15/21 0001       Neuro Re-ed    Neuro Re-ed Details  working on posture and alignement - engaging posterior shoulder girdle musculature      Neck Exercises: Seated   Neck Retraction 5 reps;10 secs    Lateral  Flexion Right;Left;5 reps    Lateral Flexion Limitations 5 sec hold    Other Seated Exercise thoracic extension on coregeous ball 30 sec x 2 reps arms crossed over chest and arms overhead; thoracic rotation with coregeous ball x 4 reps each side 10-15 sec hold      Shoulder Exercises: Standing   Extension Limitations lat pull blue TB x 10 reps    Row Strengthening;Both;10 reps;Theraband    Theraband Level (Shoulder Row) Level 4 (Blue)    Row Limitations bow and arrow 10 reps each side blue TB    Retraction Strengthening;Both;10 reps;Theraband    Theraband Level (Shoulder Retraction) Level 2 (Red)    Other Standing Exercises bent row 10# KB x 10 reps each side      Shoulder Exercises: Stretch   Star Gazer Stretch 2 reps;30 seconds   T at wall   Other Shoulder Stretches doorway x 3 positions to pt tolerance 2 reps 20-30 sec hold    Other Shoulder Stretches upper trap stretch reaching for  floor 10 sec hold x 5 reps      Moist Heat Therapy   Number Minutes Moist Heat 10 Minutes    Moist Heat Location Cervical      Manual Therapy   Manual therapy comments skilled palpation to assess response to DN and manual work    Joint Mobilization thoracic and cervical PA and lateral mobs Grade II/III    Soft tissue mobilization deep tissue work through Landscape architect and Ship broker Point Dry Needling - 07/15/21 0001     Consent Given? Yes    Education Handout Provided Previously provided    Scalenes Response Palpable increased muscle length                   PT Education - 07/15/21 0959     Education Details HEP    Person(s) Educated Patient    Methods Explanation;Demonstration;Tactile cues;Verbal cues;Handout    Comprehension Verbalized understanding;Returned demonstration;Verbal cues required;Tactile cues required                 PT Long Term Goals - 06/27/21 1055       PT LONG TERM GOAL #1   Title Improve posture and alignment with patient to demonstrated improved upright posture with posterior shoulder girdle engaged    Time 8    Period Weeks    Status New    Target Date 08/22/21      PT LONG TERM GOAL #2   Title Decrease pain in Rt upper quarter by 75-100% allowing patient to drive without increased Rt UE radicular symptoms    Time 8    Period Weeks    Status New    Target Date 08/22/21      PT LONG TERM GOAL #3   Title Increase postural strength with patient to demonstrate ability to hold head, chest, shoulders in upright position for functional activities    Time 8    Period Weeks    Status New    Target Date 08/22/21      PT LONG TERM GOAL #4   Title Independent in HEP    Time 8    Period Weeks    Status New    Target Date 08/22/21      PT LONG TERM GOAL #5   Title Improve functional limitation score to 73    Time 8    Period  Weeks    Status New    Target Date 08/22/21                    Plan - 07/15/21 0944     Clinical Impression Statement Continued positive response to treatment and home progrsm. no radicular symptoms. Progressing well with posterior shoulder girdle strengthening.    Rehab Potential Good    PT Frequency 2x / week    PT Duration 8 weeks    PT Treatment/Interventions ADLs/Self Care Home Management;Aquatic Therapy;Cryotherapy;Electrical Stimulation;Iontophoresis 4mg /ml Dexamethasone;Moist Heat;Ultrasound;Therapeutic activities;Therapeutic exercise;Balance training;Neuromuscular re-education;Patient/family education;Manual techniques;Dry needling;Taping    PT Next Visit Plan review and progress with exercise program - stretch pecs; strengthening posterior shoulder girdle; DN vs manual work pecs/upper trap/cervical musculature; postural correction; modalities as indicated    PT Home Exercise Plan    Consulted and Agree with Plan of Care Patient             Patient will benefit from skilled therapeutic intervention in order to improve the following deficits and impairments:     Visit Diagnosis: Acute pain of right shoulder  Radiculopathy, cervical region  Other symptoms and signs involving the musculoskeletal system  Abnormal posture     Problem List Patient Active Problem List   Diagnosis Date Noted   DDD (degenerative disc disease), cervical 06/17/2021   Numbness and tingling of right arm 06/14/2021   Acute pain of right shoulder 06/14/2021   Skin lesion 08/14/2020   Well adult exam 11/23/2019   Incomplete emptying of bladder 08/23/2019   Syncope 08/23/2019   GERD (gastroesophageal reflux disease) 02/09/2019   Benign prostatic hyperplasia with urinary frequency 10/27/2018   Heterozygous alpha 1-antitrypsin deficiency (HCC) 12/17/2017   Family history of alpha 1 antitrypsin deficiency 12/07/2017   Left knee pain 12/02/2016   Vitamin D deficiency 11/28/2015   First degree AV block 05/18/2015   Essential  hypertension 02/28/2015   Erectile dysfunction 03/28/2014   Solitary pulmonary nodule 11/24/2013   History of epididymitis 11/24/2013   Hyperlipidemia 11/16/2013    Larine Fielding 13/11/2013, PT, MPH  07/15/2021, 10:23 AM  [facility] [address] [place] [address] [place], [place], [place] Phone: [phone]   Fax:  [phone]  Name: Xiong Haidar MRN: Orvan Seen Date of Birth: Nov 26, 1960

## 2021-07-18 ENCOUNTER — Ambulatory Visit: Payer: Commercial Managed Care - PPO | Admitting: Rehabilitative and Restorative Service Providers"

## 2021-07-18 ENCOUNTER — Encounter: Payer: Self-pay | Admitting: Rehabilitative and Restorative Service Providers"

## 2021-07-18 DIAGNOSIS — R29898 Other symptoms and signs involving the musculoskeletal system: Secondary | ICD-10-CM

## 2021-07-18 DIAGNOSIS — M25511 Pain in right shoulder: Secondary | ICD-10-CM

## 2021-07-18 DIAGNOSIS — M5412 Radiculopathy, cervical region: Secondary | ICD-10-CM

## 2021-07-18 DIAGNOSIS — R293 Abnormal posture: Secondary | ICD-10-CM

## 2021-07-18 NOTE — Therapy (Addendum)
[facility] [facility] [place], [place], [address] Phone: [phone]   Fax:  [phone]  Physical Therapy Treatment and Discharge Summary   PHYSICAL THERAPY DISCHARGE SUMMARY  Visits from Start of Care: 6  Current functional level related to goals / functional outcomes: See progress note for discharge status    Remaining deficits: No known deficits    Education / Equipment: HEP  Patient agrees to discharge. Patient goals were met. Patient is being discharged due to meeting the stated rehab goals. Celyn P. Helene Kelp PT, MPH 08/22/21 4:41 PM    Rationale for Evaluation and Treatment Rehabilitation  Patient Details  Name: Trevor Guerrero MRN: [id number] Date of Birth: [birth year] Referring Provider (PT): Iran Planas PA - C   Encounter Date: 07/18/2021   PT End of Session - 07/18/21 1047     Visit Number 6    Number of Visits 16    Date for PT Re-Evaluation 08/22/21    PT Start Time 2449    PT Stop Time 1130    PT Time Calculation (min) 43 min    Activity Tolerance Patient tolerated treatment well             Past Medical History:  Diagnosis Date   Erectile dysfunction 03/28/2014   Essential hypertension 02/28/2015   Family history of alpha 1 antitrypsin deficiency 12/07/2017   First degree AV block 05/18/2015   Normal 48 Hour Holter Monitor May 2017    Heterozygous alpha 1-antitrypsin deficiency ([place]) 12/17/2017   Level at 103.  Less than 57 is considered severe   Hyperlipidemia 11/16/2013   AHA 10 year risk of 9.0% Nov 2016    Solitary pulmonary nodule 11/24/2013   In addition, there is a non calcified right middle lobe nodule measuring 4-5 mm. This may reflect a noncalcified granuloma. No follow-up needed if patient is low-risk. Non-contrast chest CT can be considered in 12 months if patient is high-risk  Plan to repeat CT in 1 year    Past Surgical History:  Procedure Laterality Date   APPENDECTOMY      left testicular mass Left     There were no vitals filed for this visit.   Subjective Assessment - 07/18/21 1049     Subjective No pain - no problems. Continues to work on posture and HEP. Pleased with progress.    Currently in Pain? No/denies    Pain Score 0-No pain                [facility] PT Assessment - 07/18/21 0001       Assessment   Medical Diagnosis Rt shoulder pain; cervical radicular pain    Referring Provider (PT) Iran Planas PA - C    Onset Date/Surgical Date 06/06/21    Hand Dominance Right    Next MD Visit 08/08/21   Dr Zigmund Daniel   Prior Therapy 15 yrs ago      Observation/Other Assessments   Focus on Therapeutic Outcomes (FOTO)  76      Sensation   Additional Comments resolution of all tingling and numbness in Rt fingers on an intermittent basis      Posture/Postural Control   Posture Comments improved posture with less head forward; shoulders rounded and elevated; head of the humerus anterior in orientation; scapulae abducted and rotated along the thoracic wall      AROM   Right Shoulder Flexion 155 Degrees    Right Shoulder ABduction 165 Degrees    Right Shoulder  External Rotation 75 Degrees    Left Shoulder Flexion 155 Degrees    Left Shoulder ABduction 165 Degrees    Left Shoulder External Rotation 75 Degrees    Cervical Flexion 50    Cervical Extension 28    Cervical - Right Side Bend 38    Cervical - Left Side Bend 45    Cervical - Right Rotation 68    Cervical - Left Rotation 70      Strength   Overall Strength Comments WFL's      Palpation   Spinal mobility less hypomobile thoracic and cervical PA and lateral mobs    Palpation comment decreased muscular tightness ant/lat/post cervical musculature; pecs; upper trap; teres; leveator                           [facility] Adult PT Treatment/Exercise - 07/18/21 0001       Neuro Re-ed    Neuro Re-ed Details  working on posture and alignement - engaging posterior shoulder girdle  musculature      Neck Exercises: Seated   Neck Retraction 5 reps;10 secs    Lateral Flexion Right;Left;5 reps    Lateral Flexion Limitations 5 sec hold      Shoulder Exercises: Standing   Extension Limitations lat pull blue TB x 10 reps    Row Strengthening;Both;10 reps;Theraband    Theraband Level (Shoulder Row) Level 4 (Blue)    Row Limitations bow and arrow 10 reps each side blue TB    Retraction Strengthening;Both;10 reps;Theraband    Theraband Level (Shoulder Retraction) Level 2 (Red)    Other Standing Exercises counter plank 60 sec x 2; thoracic extension with UE elevation in counter plank position x 5 each side 2-3 sec hold      Shoulder Exercises: Stretch   Star Gazer Stretch 2 reps;30 seconds   T at wall   Other Shoulder Stretches doorway x 3 positions to pt tolerance 2 reps 20-30 sec hold    Other Shoulder Stretches upper trap stretch reaching for floor 10 sec hold x 5 reps      Manual Therapy   Manual therapy comments skilled palpation to assess response to DN and manual work    Joint Mobilization thoracic and cervical PA and lateral mobs Grade II/III    Soft tissue mobilization deep tissue work through Landscape architect and Ship broker Point Dry Needling - 07/18/21 0001     Consent Given? Yes    Education Handout Provided Previously provided    Other Dry Needling bilat    Oblique Capitus Response Palpable increased muscle length    Suboccipitals Response Palpable increased muscle length    Scalenes Response Palpable increased muscle length                   PT Education - 07/18/21 1054     Education Details HEP    Person(s) Educated Patient    Methods Explanation;Demonstration;Tactile cues;Verbal cues;Handout    Comprehension Verbalized understanding;Returned demonstration;Verbal cues required;Tactile cues required                 PT Long Term Goals - 07/18/21 1049       PT LONG TERM GOAL #1   Title Improve  posture and alignment with patient to demonstrated improved upright posture with posterior shoulder girdle engaged    Time 8    Status Achieved  Target Date 08/22/21      PT LONG TERM GOAL #2   Title Decrease pain in Rt upper quarter by 75-100% allowing patient to drive without increased Rt UE radicular symptoms    Time 8    Period Weeks    Status Achieved    Target Date 08/22/21      PT LONG TERM GOAL #3   Title Increase postural strength with patient to demonstrate ability to hold head, chest, shoulders in upright position for functional activities    Time 8    Status Achieved      PT LONG TERM GOAL #4   Title Independent in HEP    Time 8    Period Weeks    Status Achieved    Target Date 08/22/21      PT LONG TERM GOAL #5   Title Improve functional limitation score to 73    Time 8    Period Weeks    Status Achieved    Target Date 08/22/21                   Plan - 07/18/21 1100     Clinical Impression Statement Excellent progress with goals of therapy accomplished. Tadarius is pain free and independent in HEP    Rehab Potential Good    PT Frequency 2x / week    PT Duration 8 weeks    PT Treatment/Interventions ADLs/Self Care Home Management;Aquatic Therapy;Cryotherapy;Electrical Stimulation;Iontophoresis 47m/ml Dexamethasone;Moist Heat;Ultrasound;Therapeutic activities;Therapeutic exercise;Balance training;Neuromuscular re-education;Patient/family education;Manual techniques;Dry needling;Taping    PT Next Visit Plan hold PT - patient will call with any questions or problems    PT Home Exercise Plan [id number]   Consulted and Agree with Plan of Care Patient             Patient will benefit from skilled therapeutic intervention in order to improve the following deficits and impairments:     Visit Diagnosis: Acute pain of right shoulder  Radiculopathy, cervical region  Other symptoms and signs involving the musculoskeletal system  Abnormal  posture     Problem List Patient Active Problem List   Diagnosis Date Noted   DDD (degenerative disc disease), cervical 06/17/2021   Numbness and tingling of right arm 06/14/2021   Acute pain of right shoulder 06/14/2021   Skin lesion 08/14/2020   Well adult exam 11/23/2019   Incomplete emptying of bladder 08/23/2019   Syncope 08/23/2019   GERD (gastroesophageal reflux disease) 02/09/2019   Benign prostatic hyperplasia with urinary frequency 10/27/2018   Heterozygous alpha [place]antitrypsin deficiency ([place] 12/17/2017   Family history of alpha 1 antitrypsin deficiency 12/07/2017   Left knee pain 12/02/2016   Vitamin D deficiency 11/28/2015   First degree AV block 05/18/2015   Essential hypertension 02/28/2015   Erectile dysfunction 03/28/2014   Solitary pulmonary nodule 11/24/2013   History of epididymitis 11/24/2013   Hyperlipidemia 11/16/2013    Louella Medaglia PNilda Simmer PT, MPH  07/18/2021, 11:44 AM  [facility] [address]Phone: [phone]  Fax:  [phone] Name: RCodee TutsonMRN: [id number] of Birth: 9June 29, 1962

## 2021-07-18 NOTE — Patient Instructions (Signed)
Access Code: [email or web address] URL: [email or web address] Date: 07/18/2021 Prepared by: Corlis Leak  Exercises - Seated Cervical Retraction  - 3 x daily - 7 x weekly - 1 sets - 10 reps - Standing Scapular Retraction  - 3 x daily - 7 x weekly - 1 sets - 10 reps - 10 hold - Shoulder External Rotation and Scapular Retraction  - 3 x daily - 7 x weekly - 1 sets - 10 reps -   hold - Doorway Pec Stretch at 60 Degrees Abduction  - 3 x daily - 7 x weekly - 1 sets - 3 reps - Doorway Pec Stretch at 90 Degrees Abduction  - 3 x daily - 7 x weekly - 1 sets - 3 reps - 30 seconds  hold - Doorway Pec Stretch at 120 Degrees Abduction  - 3 x daily - 7 x weekly - 1 sets - 3 reps - 30 second hold  hold - Seated Hip Flexor Stretch  - 2 x daily - 7 x weekly - 1 sets - 3 reps - 30 sec  hold - Shoulder External Rotation in 45 Degrees Abduction  - 2 x daily - 7 x weekly - 1-2 sets - 10 reps - 3 sec  hold - Seated Cervical Sidebending AROM  - 2 x daily - 7 x weekly - 1 sets - 5 reps - 5-10 sec  hold - Shoulder External Rotation and Scapular Retraction with Resistance  - 2 x daily - 7 x weekly - 3 sets - 10 reps - Scapular Retraction with Resistance  - 2 x daily - 7 x weekly - 3 sets - 10 reps - Drawing Bow  - 1 x daily - 7 x weekly - 1 sets - 10 reps - 3 sec  hold - Standing Lat Pull Down with Resistance - Elbows Bent  - 2 x daily - 7 x weekly - 1 sets - 10 reps - 3 sec  hold - Standing Shoulder External Rotation Stretch at Wall  - 2 x daily - 7 x weekly - 1 sets - 3 reps - 30 sec  hold - Standing Bent Over Shoulder Row with Resistance  - 1 x daily - 7 x weekly - 2-3 sets - 10 reps - 3 sec  hold - Seated Thoracic Extension Arms Overhead  - 1 x daily - 7 x weekly - 1 sets - 3 reps - 10sec  hold - Seated Thoracic Extension and Rotation with Reach  - 2 x daily - 7 x weekly - 1 sets - 3 reps - 10 sec  hold - Plank on Counter  - 2 x daily - 7 x weekly - 1 sets - 3 reps - 30 sec  hold - Plank with Shoulder Flexion on  Counter  - 2 x daily - 7 x weekly - 1 sets - 5-10 reps - 3-5 sec  hold

## 2021-08-08 ENCOUNTER — Encounter: Payer: Commercial Managed Care - PPO | Admitting: Family Medicine

## 2021-08-12 ENCOUNTER — Other Ambulatory Visit: Payer: Self-pay | Admitting: Physician Assistant

## 2021-08-12 DIAGNOSIS — R200 Anesthesia of skin: Secondary | ICD-10-CM

## 2021-08-12 DIAGNOSIS — M25511 Pain in right shoulder: Secondary | ICD-10-CM

## 2021-08-15 ENCOUNTER — Encounter: Payer: Self-pay | Admitting: Family Medicine

## 2021-08-15 ENCOUNTER — Ambulatory Visit (INDEPENDENT_AMBULATORY_CARE_PROVIDER_SITE_OTHER): Payer: Commercial Managed Care - PPO | Admitting: Family Medicine

## 2021-08-15 VITALS — BP 131/73 | HR 57 | Ht 69.0 in | Wt 262.0 lb

## 2021-08-15 DIAGNOSIS — R350 Frequency of micturition: Secondary | ICD-10-CM

## 2021-08-15 DIAGNOSIS — Z0000 Encounter for general adult medical examination without abnormal findings: Secondary | ICD-10-CM | POA: Diagnosis not present

## 2021-08-15 DIAGNOSIS — G8929 Other chronic pain: Secondary | ICD-10-CM

## 2021-08-15 DIAGNOSIS — E559 Vitamin D deficiency, unspecified: Secondary | ICD-10-CM | POA: Diagnosis not present

## 2021-08-15 DIAGNOSIS — I10 Essential (primary) hypertension: Secondary | ICD-10-CM | POA: Diagnosis not present

## 2021-08-15 DIAGNOSIS — M25561 Pain in right knee: Secondary | ICD-10-CM

## 2021-08-15 DIAGNOSIS — E782 Mixed hyperlipidemia: Secondary | ICD-10-CM | POA: Diagnosis not present

## 2021-08-15 DIAGNOSIS — N401 Enlarged prostate with lower urinary tract symptoms: Secondary | ICD-10-CM

## 2021-08-15 MED ORDER — TAMSULOSIN HCL 0.4 MG PO CAPS: 0.4000 mg | ORAL_CAPSULE | Freq: Every day | ORAL | 3 refills | Status: DC

## 2021-08-15 MED ORDER — ATORVASTATIN CALCIUM 20 MG PO TABS
ORAL_TABLET | ORAL | 3 refills | Status: DC
Start: 2021-08-15 — End: 2022-08-19

## 2021-08-15 MED ORDER — DICLOFENAC SODIUM 2 % EX SOLN
2.0000 | Freq: Two times a day (BID) | CUTANEOUS | 3 refills | Status: DC | PRN
Start: 2021-08-15 — End: 2022-08-19

## 2021-08-15 MED ORDER — OMEPRAZOLE 40 MG PO CPDR: 40.0000 mg | DELAYED_RELEASE_CAPSULE | Freq: Every day | ORAL | 3 refills | Status: DC

## 2021-08-15 MED ORDER — LISINOPRIL 10 MG PO TABS: 10.0000 mg | ORAL_TABLET | Freq: Every day | ORAL | 1 refills | Status: DC

## 2021-08-15 NOTE — Progress Notes (Signed)
Trevor Guerrero - 61 y.o. male MRN [id number]  Date of birth: [birth year]  Subjective Chief Complaint  Patient presents with   Annual Exam    HPI Trevor Guerrero is a 61 y.o. male here today for annual exam.    Reports that he is doing pretty well.  BP is elevated today. He is also having some knee pain.   Doing well with chronic medications.   His job keeps him pretty active.  He feels like diet could be better.   Former smoker and denies EtOH use.   He is due for shingrix.  Colonoscopy-Due 2025  Review of Systems  Constitutional:  Negative for chills, fever, malaise/fatigue and weight loss.  HENT:  Negative for congestion, ear pain and sore throat.   Eyes:  Negative for blurred vision, double vision and pain.  Respiratory:  Negative for cough and shortness of breath.   Cardiovascular:  Negative for chest pain and palpitations.  Gastrointestinal:  Negative for abdominal pain, blood in stool, constipation, heartburn and nausea.  Genitourinary:  Negative for dysuria and urgency.  Musculoskeletal:  Negative for joint pain and myalgias.  Neurological:  Negative for dizziness and headaches.  Endo/Heme/Allergies:  Does not bruise/bleed easily.  Psychiatric/Behavioral:  Negative for depression. The patient is not nervous/anxious and does not have insomnia.      No Known Allergies  Past Medical History:  Diagnosis Date   Erectile dysfunction 03/28/2014   Essential hypertension 02/28/2015   Family history of alpha 1 antitrypsin deficiency 12/07/2017   First degree AV block 05/18/2015   Normal 48 Hour Holter Monitor May 2017    Heterozygous alpha 1-antitrypsin deficiency (HCC) 12/17/2017   Level at 75.  Less than 57 is considered severe   Hyperlipidemia 11/16/2013   AHA 10 year risk of 9.0% Nov 2016    Solitary pulmonary nodule 11/24/2013   In addition, there is a non calcified right middle lobe nodule measuring 4-5 mm. This may reflect a noncalcified granuloma. No follow-up needed  if patient is low-risk. Non-contrast chest CT can be considered in 12 months if patient is high-risk  Plan to repeat CT in 1 year    Past Surgical History:  Procedure Laterality Date   APPENDECTOMY     left testicular mass Left     Social History   Socioeconomic History   Marital status: Married    Spouse name: Not on file   Number of children: Not on file   Years of education: Not on file   Highest education level: Not on file  Occupational History   Not on file  Tobacco Use   Smoking status: Former    Packs/day: 0.25    Years: 2.00    Total pack years: 0.50    Types: Cigarettes    Quit date: 11/16/1979    Years since quitting: 41.7   Smokeless tobacco: Never  Substance and Sexual Activity   Alcohol use: No    Alcohol/week: 0.0 standard drinks of alcohol   Drug use: No   Sexual activity: Yes    Partners: Female  Other Topics Concern   Not on file  Social History Narrative   Not on file   Social Determinants of Health   Financial Resource Strain: Not on file  Food Insecurity: Not on file  Transportation Needs: Not on file  Physical Activity: Not on file  Stress: Not on file  Social Connections: Not on file    Family History  Problem Relation Age of Onset  Alpha-1 antitrypsin deficiency Sister    Asthma Neg Hx    Heart failure Neg Hx    Hypertension Neg Hx     Health Maintenance  Topic Date Due   COVID-19 Vaccine (3 - Pfizer series) 11/15/2021 (Originally 11/04/2019)   Zoster Vaccines- Shingrix (1 of 2) 11/15/2021 (Originally 09/15/2009)   INFLUENZA VACCINE  04/06/2022 (Originally 08/06/2021)   COLONOSCOPY (Pts 45-21yrs Insurance coverage will need to be confirmed)  01/03/2024   TETANUS/TDAP  12/03/2026   Hepatitis C Screening  Completed   HIV Screening  Completed   HPV VACCINES  Aged Out      ----------------------------------------------------------------------------------------------------------------------------------------------------------------------------------------------------------------- Physical Exam BP 131/73 (BP Location: Left Arm, Patient Position: Sitting, Cuff Size: Large)   Pulse (!) 57   Ht 5\' 9"  (1.753 m)   Wt 262 lb (118.8 kg)   SpO2 97%   BMI 38.69 kg/m   Physical Exam Constitutional:      General: He is not in acute distress. HENT:     Head: Normocephalic and atraumatic.     Right Ear: Tympanic membrane and external ear normal.     Left Ear: Tympanic membrane and external ear normal.  Eyes:     General: No scleral icterus. Neck:     Thyroid: No thyromegaly.  Cardiovascular:     Rate and Rhythm: Normal rate and regular rhythm.     Heart sounds: Normal heart sounds.  Pulmonary:     Effort: Pulmonary effort is normal.     Breath sounds: Normal breath sounds.  Abdominal:     General: Bowel sounds are normal. There is no distension.     Palpations: Abdomen is soft.     Tenderness: There is no abdominal tenderness. There is no guarding.  Musculoskeletal:     Cervical back: Normal range of motion.  Lymphadenopathy:     Cervical: No cervical adenopathy.  Skin:    General: Skin is warm and dry.     Findings: No rash.  Neurological:     Mental Status: He is alert and oriented to person, place, and time.     Cranial Nerves: No cranial nerve deficit.     Motor: No abnormal muscle tone.  Psychiatric:        Mood and Affect: Mood normal.        Behavior: Behavior normal.     ------------------------------------------------------------------------------------------------------------------------------------------------------------------------------------------------------------------- Assessment and Plan  Right knee pain Adding pennsaid as needed.  He has meloxicam as well.  If not improving with this will plan to obtain imaging and  consider injection    Well adult exam Well adult Orders Placed This Encounter  Procedures   COMPLETE METABOLIC PANEL WITH GFR   CBC with Differential   Lipid Panel w/reflex Direct LDL   PSA   TSH   Vitamin D (25 hydroxy)  Screenings; Per lab orders Immunizations:  Declines shingrix Anticipatory guidance/Risk factor reduction:  Recommendations per AVS.     Meds ordered this encounter  Medications   diclofenac Sodium (PENNSAID) 2 % SOLN    Sig: Apply 2 Pump (40 mg total) topically 2 (two) times daily as needed.    Dispense:  112 g    Refill:  3   DISCONTD: lisinopril (ZESTRIL) 10 MG tablet    Sig: Take 1 tablet (10 mg total) by mouth daily.    Dispense:  90 tablet    Refill:  1   atorvastatin (LIPITOR) 20 MG tablet    Sig: TAKE 1 TABLET(20 MG) BY MOUTH DAILY  Dispense:  90 tablet    Refill:  3   omeprazole (PRILOSEC) 40 MG capsule    Sig: Take 1 capsule (40 mg total) by mouth daily.    Dispense:  90 capsule    Refill:  3   tamsulosin (FLOMAX) 0.4 MG CAPS capsule    Sig: Take 1 capsule (0.4 mg total) by mouth daily after breakfast.    Dispense:  90 capsule    Refill:  3    Return in about 6 months (around 02/15/2022) for HTN.    This visit occurred during the SARS-CoV-2 public health emergency.  Safety protocols were in place, including screening questions prior to the visit, additional usage of staff PPE, and extensive cleaning of exam room while observing appropriate contact time as indicated for disinfecting solutions.

## 2021-08-15 NOTE — Assessment & Plan Note (Signed)
Well adult Orders Placed This Encounter  Procedures  . COMPLETE METABOLIC PANEL WITH GFR  . CBC with Differential  . Lipid Panel w/reflex Direct LDL  . PSA  . TSH  . Vitamin D (25 hydroxy)  Screenings; Per lab orders Immunizations:  Declines shingrix Anticipatory guidance/Risk factor reduction:  Recommendations per AVS.

## 2021-08-15 NOTE — Patient Instructions (Signed)

## 2021-08-15 NOTE — Assessment & Plan Note (Signed)
Adding pennsaid as needed.  He has meloxicam as well.  If not improving with this will plan to obtain imaging and consider injection

## 2021-08-16 LAB — COMPLETE METABOLIC PANEL WITH GFR
AG Ratio: 2.2 (calc) (ref 1.0–2.5)
ALT: 64 U/L — ABNORMAL HIGH (ref 9–46)
AST: 36 U/L — ABNORMAL HIGH (ref 10–35)
Albumin: 4.4 g/dL (ref 3.6–5.1)
Alkaline phosphatase (APISO): 84 U/L (ref 35–144)
BUN: 23 mg/dL (ref 7–25)
CO2: 24 mmol/L (ref 20–32)
Calcium: 10.1 mg/dL (ref 8.6–10.3)
Chloride: 106 mmol/L (ref 98–110)
Creat: 0.96 mg/dL (ref 0.70–1.35)
Globulin: 2 g/dL (calc) (ref 1.9–3.7)
Glucose, Bld: 119 mg/dL — ABNORMAL HIGH (ref 65–99)
Potassium: 4 mmol/L (ref 3.5–5.3)
Sodium: 139 mmol/L (ref 135–146)
Total Bilirubin: 1.2 mg/dL (ref 0.2–1.2)
Total Protein: 6.4 g/dL (ref 6.1–8.1)
eGFR: 90 mL/min/{1.73_m2} (ref >=60)

## 2021-08-16 LAB — CBC WITH DIFFERENTIAL/PLATELET
Absolute Monocytes: 571 cells/uL (ref 200–950)
Basophils Absolute: 48 cells/uL (ref 0–200)
Basophils Relative: 1 %
Eosinophils Absolute: 91 cells/uL (ref 15–500)
Eosinophils Relative: 1.9 %
HCT: 43.6 % (ref 38.5–50.0)
Hemoglobin: 14.8 g/dL (ref 13.2–17.1)
Lymphs Abs: 1450 cells/uL (ref 850–3900)
MCH: 30.7 pg (ref 27.0–33.0)
MCHC: 33.9 g/dL (ref 32.0–36.0)
MCV: 90.5 fL (ref 80.0–100.0)
MPV: 10 fL (ref 7.5–12.5)
Monocytes Relative: 11.9 %
Neutro Abs: 2640 cells/uL (ref 1500–7800)
Neutrophils Relative %: 55 %
Platelets: 238 10*3/uL (ref 140–400)
RBC: 4.82 10*6/uL (ref 4.20–5.80)
RDW: 13.1 % (ref 11.0–15.0)
Total Lymphocyte: 30.2 %
WBC: 4.8 10*3/uL (ref 3.8–10.8)

## 2021-08-16 LAB — LIPID PANEL W/REFLEX DIRECT LDL
Cholesterol: 130 mg/dL (ref <200)
HDL: 35 mg/dL — ABNORMAL LOW (ref >=40)
LDL Cholesterol (Calc): 72 mg/dL (calc)
Non-HDL Cholesterol (Calc): 95 mg/dL (calc) (ref <130)
Total CHOL/HDL Ratio: 3.7 (calc) (ref <5.0)
Triglycerides: 147 mg/dL (ref <150)

## 2021-08-16 LAB — PSA: PSA: 0.33 ng/mL (ref <=4.00)

## 2021-08-16 LAB — TSH: TSH: 1.67 mIU/L (ref 0.40–4.50)

## 2021-08-16 LAB — VITAMIN D 25 HYDROXY (VIT D DEFICIENCY, FRACTURES): Vit D, 25-Hydroxy: 39 ng/mL (ref 30–100)

## 2021-09-02 ENCOUNTER — Telehealth: Payer: Self-pay | Admitting: General Practice

## 2021-09-02 NOTE — Telephone Encounter (Signed)
Transition Care Management Follow-up Telephone Call Date of discharge and from where: 08/30/21 from [facility] How have you been since you were released from the hospital? Still in pain. He is waiting to hear back from worker's comp to see where he can go. He has seen the orthopedic and occupational health. Any questions or concerns? No  Items Reviewed: Did the pt receive and understand the discharge instructions provided? Yes  Medications obtained and verified? No  Other? No  Any new allergies since your discharge? No  Dietary orders reviewed? Yes Do you have support at home? Yes   Home Care and Equipment/Supplies: Were home health services ordered? no  Functional Questionnaire: (I = Independent and D = Dependent) ADLs: I  Bathing/Dressing- I  Meal Prep- I  Eating- I  Maintaining continence- I  Transferring/Ambulation- I  Managing Meds- I  Follow up appointments reviewed:  PCP Hospital f/u appt confirmed? No. Patient will call us back to make an appointment. Specialist Hospital f/u appt confirmed? Yes  Scheduled to see the orthopedics on 09/02/21. Are transportation arrangements needed? No  If their condition worsens, is the pt aware to call PCP or go to the Emergency Dept.? Yes Was the patient provided with contact information for the PCP's office or ED? Yes Was to pt encouraged to call back with questions or concerns? Yes

## 2021-09-13 DIAGNOSIS — M25561 Pain in right knee: Secondary | ICD-10-CM | POA: Insufficient documentation

## 2021-09-16 DIAGNOSIS — M2391 Unspecified internal derangement of right knee: Secondary | ICD-10-CM | POA: Insufficient documentation

## 2021-10-09 DIAGNOSIS — S83241A Other tear of medial meniscus, current injury, right knee, initial encounter: Secondary | ICD-10-CM | POA: Insufficient documentation

## 2022-02-17 ENCOUNTER — Ambulatory Visit (INDEPENDENT_AMBULATORY_CARE_PROVIDER_SITE_OTHER): Payer: Commercial Managed Care - PPO | Admitting: Family Medicine

## 2022-02-17 ENCOUNTER — Encounter: Payer: Self-pay | Admitting: Family Medicine

## 2022-02-17 VITALS — BP 148/86 | HR 52 | Ht 72.0 in | Wt 262.4 lb

## 2022-02-17 DIAGNOSIS — I10 Essential (primary) hypertension: Secondary | ICD-10-CM

## 2022-02-17 DIAGNOSIS — R339 Retention of urine, unspecified: Secondary | ICD-10-CM | POA: Diagnosis not present

## 2022-02-17 DIAGNOSIS — E782 Mixed hyperlipidemia: Secondary | ICD-10-CM | POA: Diagnosis not present

## 2022-02-17 NOTE — Assessment & Plan Note (Signed)
He has done well with atorvastatin at current strength.  Recommend continuation.

## 2022-02-17 NOTE — Assessment & Plan Note (Signed)
He would like to try off of this.

## 2022-02-17 NOTE — Progress Notes (Signed)
Trevor Guerrero - 62 y.o. male MRN [id number]  Date of birth: Jun 27, 1960  Subjective Chief Complaint  Patient presents with   Follow-up    Blood pressure     HPI Trevor Guerrero is a 62 y.o. male here today for follow up.   Reports that he is doing pretty well.  He had R knee surgery in October.    He continues on atorvastatin for management of HLD.  Tolerating well.  Lab Results  Component Value Date   LDLCALC 72 08/15/2021   He is not taking lisinopril for HTN.  BP is elevated today.  He denies symptoms related to HTN including chest pain, shortness of breath, palpitations, headache or vision changes.   He has taken flomax for history of BPH.  He denies LUTS at this time.   ROS:  A comprehensive ROS was completed and negative except as noted per HPI  No Known Allergies  Past Medical History:  Diagnosis Date   Erectile dysfunction 03/28/2014   Essential hypertension 02/28/2015   Family history of alpha 1 antitrypsin deficiency 12/07/2017   First degree AV block 05/18/2015   Normal 48 Hour Holter Monitor May 2017    Heterozygous alpha 1-antitrypsin deficiency ([facility]) 12/17/2017   Level at 39.  Less than 57 is considered severe   Hyperlipidemia 11/16/2013   AHA 10 year risk of 9.0% Nov 2016    Solitary pulmonary nodule 11/24/2013   In addition, there is a non calcified right middle lobe nodule measuring 4-5 mm. This may reflect a noncalcified granuloma. No follow-up needed if patient is low-risk. Non-contrast chest CT can be considered in 12 months if patient is high-risk  Plan to repeat CT in 1 year    Past Surgical History:  Procedure Laterality Date   APPENDECTOMY     left testicular mass Left     Social History   Socioeconomic History   Marital status: Married    Spouse name: Not on file   Number of children: Not on file   Years of education: Not on file   Highest education level: Not on file  Occupational History   Not on file  Tobacco Use   Smoking  status: Former    Packs/day: 0.25    Years: 2.00    Total pack years: 0.50    Types: Cigarettes    Quit date: 11/16/1979    Years since quitting: 42.2   Smokeless tobacco: Never  Substance and Sexual Activity   Alcohol use: No    Alcohol/week: 0.0 standard drinks of alcohol   Drug use: No   Sexual activity: Yes    Partners: Female  Other Topics Concern   Not on file  Social History Narrative   Not on file   Social Determinants of Health   Financial Resource Strain: Not on file  Food Insecurity: Not on file  Transportation Needs: Not on file  Physical Activity: Not on file  Stress: Not on file  Social Connections: Not on file    Family History  Problem Relation Age of Onset   Alpha-1 antitrypsin deficiency Sister    Asthma Neg Hx    Heart failure Neg Hx    Hypertension Neg Hx     Health Maintenance  Topic Date Due   COVID-19 Vaccine (3 - 2023-24 season) 03/05/2022 (Originally 09/06/2021)   INFLUENZA VACCINE  04/06/2022 (Originally 08/06/2021)   Zoster Vaccines- Shingrix (1 of 2) 05/18/2022 (Originally 09/15/2009)   COLONOSCOPY (Pts 45-28yr Insurance coverage will need  to be confirmed)  01/03/2024   DTaP/Tdap/Td (4 - Td or Tdap) 12/03/2026   Hepatitis C Screening  Completed   HIV Screening  Completed   HPV VACCINES  Aged Out     ----------------------------------------------------------------------------------------------------------------------------------------------------------------------------------------------------------------- Physical Exam BP (!) 148/86   Pulse (!) 52   Ht 6' (1.829 m)   Wt 262 lb 6.4 oz (119 kg)   SpO2 96%   BMI 35.59 kg/m   Physical Exam Constitutional:      Appearance: Normal appearance.  HENT:     Head: Normocephalic and atraumatic.  Eyes:     General: No scleral icterus. Cardiovascular:     Rate and Rhythm: Normal rate and regular rhythm.  Pulmonary:     Effort: Pulmonary effort is normal.     Breath sounds: Normal breath  sounds.  Musculoskeletal:     Cervical back: Neck supple.  Neurological:     Mental Status: He is alert.  Psychiatric:        Mood and Affect: Mood normal.        Behavior: Behavior normal.     ------------------------------------------------------------------------------------------------------------------------------------------------------------------------------------------------------------------- Assessment and Plan  Hyperlipidemia He has done well with atorvastatin at current strength.  Recommend continuation.   Incomplete emptying of bladder He would like to try off of this.   Essential hypertension BP elevated.  Low sodium diet encouraged.  Return in 2 weeks for BP check.     No orders of the defined types were placed in this encounter.   Return in about 6 months (around 08/18/2022) for Annual exam/Fasting labs.    This visit occurred during the SARS-CoV-2 public health emergency.  Safety protocols were in place, including screening questions prior to the visit, additional usage of staff PPE, and extensive cleaning of exam room while observing appropriate contact time as indicated for disinfecting solutions.

## 2022-02-17 NOTE — Assessment & Plan Note (Signed)
BP elevated.  Low sodium diet encouraged.  Return in 2 weeks for BP check.

## 2022-03-04 ENCOUNTER — Ambulatory Visit (INDEPENDENT_AMBULATORY_CARE_PROVIDER_SITE_OTHER): Payer: Commercial Managed Care - PPO | Admitting: Family Medicine

## 2022-03-04 VITALS — BP 145/73 | HR 66 | Ht 72.0 in

## 2022-03-04 DIAGNOSIS — I10 Essential (primary) hypertension: Secondary | ICD-10-CM | POA: Diagnosis not present

## 2022-03-04 NOTE — Patient Instructions (Signed)
Patient will keep scheduled appointment for HTN f/u with Dr. Zigmund Daniel scheduled for 08/19/2022.

## 2022-03-04 NOTE — Progress Notes (Signed)
   Established Patient Office Visit  Subjective   Patient ID: Raymir Bendik, male    DOB: 1960-11-07  Age: 62 y.o. MRN: [id number]  Chief Complaint  Patient presents with   Hypertension    BP check nurse visit     HPI  Hypertension- BP check nurse visit- patient denies chest pain, shortness of breath, palpitations, headahces, vision changes or problems with medication. Patient states he has been following a low sodium diet for the most part.   ROS    Objective:     BP (!) 145/73 (BP Location: Left Arm, Patient Position: Sitting, Cuff Size: Large)   Pulse 66   Ht 6' (1.829 m)   SpO2 97%   BMI 35.59 kg/m    Physical Exam   No results found for any visits on 03/04/22.    The 10-year ASCVD risk score (Arnett DK, et al., 2019) is: 12.9%    Assessment & Plan:  Hypertension- BP check - initial reading 145/73, second reading 137/79. Per Dr. Zigmund Daniel second reading within acceptable range. Patient will keep scheduled appointment for HTN f/u with Dr. Zigmund Daniel scheduled for 08/19/2022. Problem List Items Addressed This Visit       Cardiovascular and Mediastinum   Essential hypertension - Primary (Chronic)    Return in about 24 weeks (around 08/19/2022) for HTN follow up with Dr. Zigmund Daniel. Rae Lips, LPN

## 2022-03-04 NOTE — Progress Notes (Signed)
Medical screening examination/treatment was performed by qualified clinical staff member and as supervising physician I was immediately available for consultation/collaboration. I have reviewed documentation and agree with assessment and plan.  Asyia Hornung, DO  

## 2022-07-07 ENCOUNTER — Telehealth: Payer: Self-pay

## 2022-07-07 NOTE — Transitions of Care (Post Inpatient/ED Visit) (Signed)
   07/07/2022  Name: Trevor Guerrero MRN: [id number] DOB: 13-Sep-1960  Today's TOC FU Call Status: Today's TOC FU Call Status:: Successful TOC FU Call Competed TOC FU Call Complete Date: 07/07/22  Transition Care Management Follow-up Telephone Call Date of Discharge: 07/03/22 Discharge Facility: Other ([facility]) Name of Other ([facility]) [facility]charge Facility: [facility] Type of Discharge: Inpatient Admission Primary Inpatient Discharge Diagnosis:: intestinal obstruction How have you been since you were released from the hospital?: Better Any questions or concerns?: No  Items Reviewed: Did you receive and understand the discharge instructions provided?: No Medications obtained,verified, and reconciled?: Yes (Medications Reviewed) Any new allergies since your discharge?: No Dietary orders reviewed?: Yes Do you have support at home?: Yes People in Home: spouse  Medications Reviewed Today: Medications Reviewed Today     Reviewed by Karena Addison, LPN (Licensed Practical Nurse) on 07/07/22 at 1409  Med List Status: <None>   Medication Order Taking? Sig Documenting Provider Last Dose Status Informant  aspirin 81 MG tablet [id number] No Take 81 mg by mouth daily. [provider] Taking Active   atorvastatin (LIPITOR) 20 MG tablet [id number] No TAKE 1 TABLET(20 MG) BY MOUTH DAILY Everrett Coombe, DO Taking Active   diclofenac Sodium (PENNSAID) 2 % SOLN [id number] No Apply 2 Pump (40 mg total) topically 2 (two) times daily as needed. Everrett Coombe, DO Taking Active   lisinopril (ZESTRIL) 10 MG tablet [id number] No  [provider] Taking Active   meloxicam (MOBIC) 15 MG tablet [id number] No TAKE 1 TABLET(15 MG) BY MOUTH DAILY Everrett Coombe, DO Taking Active   omeprazole (PRILOSEC) 40 MG capsule [id number] No Take 1 capsule (40 mg total) by mouth daily. Everrett Coombe, DO Taking Active   ranitidine (ZANTAC) 150 MG tablet [id number] No TAKE 1 TABLET(150 MG) BY  MOUTH AT BEDTIME [provider] Taking Active   sildenafil (VIAGRA) 100 MG tablet [id number] No Take by mouth. [provider] Taking Active   traMADol (ULTRAM) 50 MG tablet [id number] No Take 50 mg by mouth every 6 (six) hours. [provider] Taking Active             Home Care and Equipment/Supplies: Were Home Health Services Ordered?: NA Any new equipment or medical supplies ordered?: NA  Functional Questionnaire: Do you need assistance with bathing/showering or dressing?: No Do you need assistance with meal preparation?: No Do you need assistance with eating?: No Do you have difficulty maintaining continence: No Do you need assistance with getting out of bed/getting out of a chair/moving?: No Do you have difficulty managing or taking your medications?: No  Follow up appointments reviewed: PCP Follow-up appointment confirmed?: No (declined) MD Provider Line Number:[phone] Given: No Specialist Hospital Follow-up appointment confirmed?: NA Do you need transportation to your follow-up appointment?: No Do you understand care options if your condition(s) worsen?: Yes-patient verbalized understanding    SIGNATURE Karena Addison, LPN [facility] Nurse Health Advisor Direct Dial [phone]

## 2022-07-13 IMAGING — DX DG SHOULDER 2+V*R*
3 series · 3 of 3 positions shown · non-contrast
Comparison: None Available.

CLINICAL DATA: Right shoulder pain

EXAM:
RIGHT SHOULDER - 2+ VIEW

[shoulder grashey]
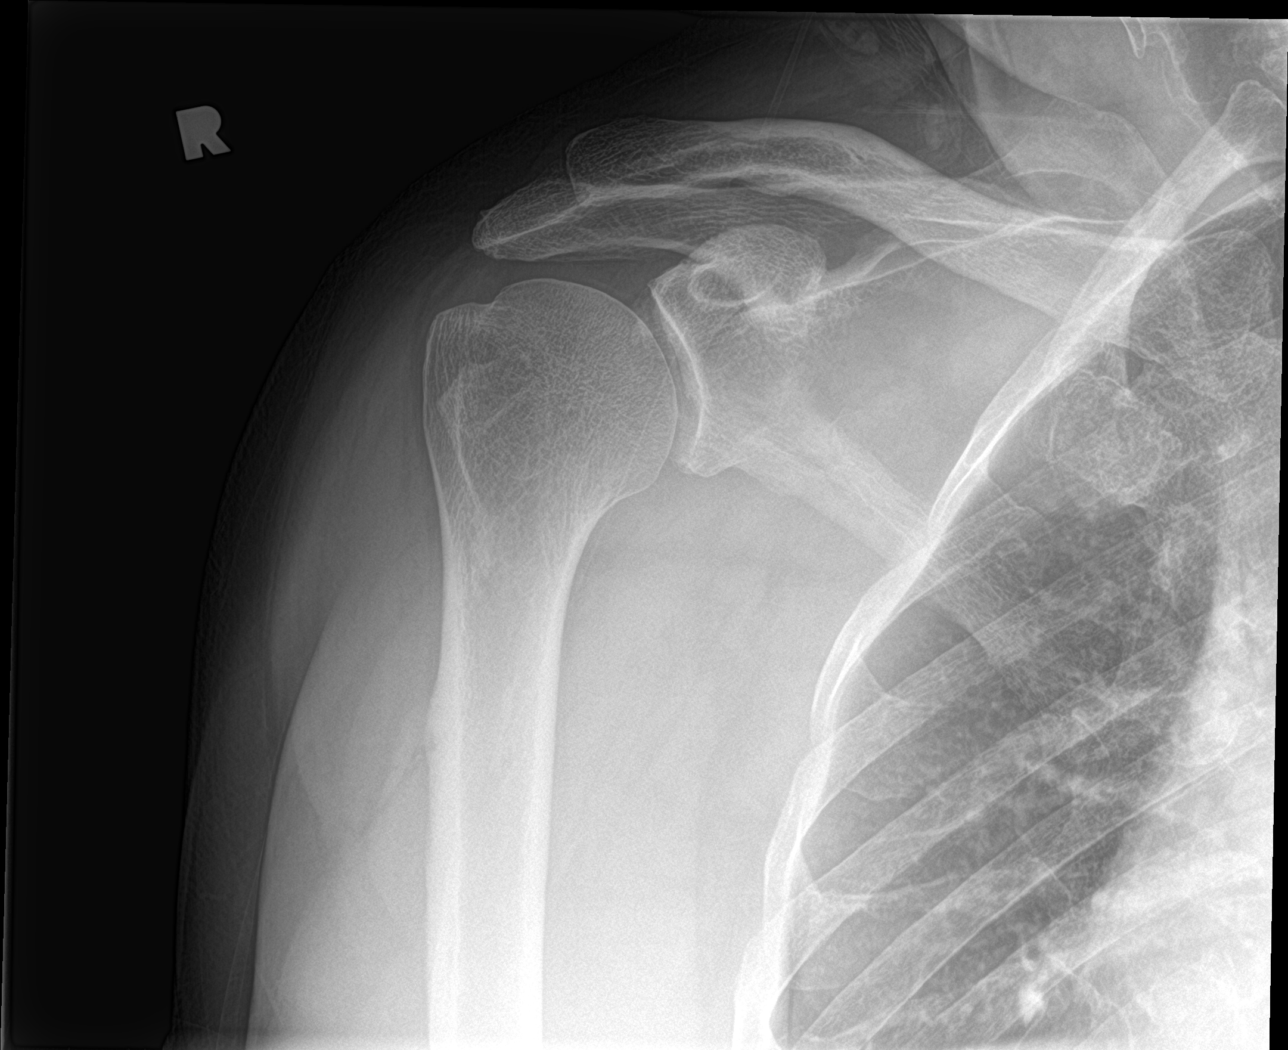

[shoulder y view]
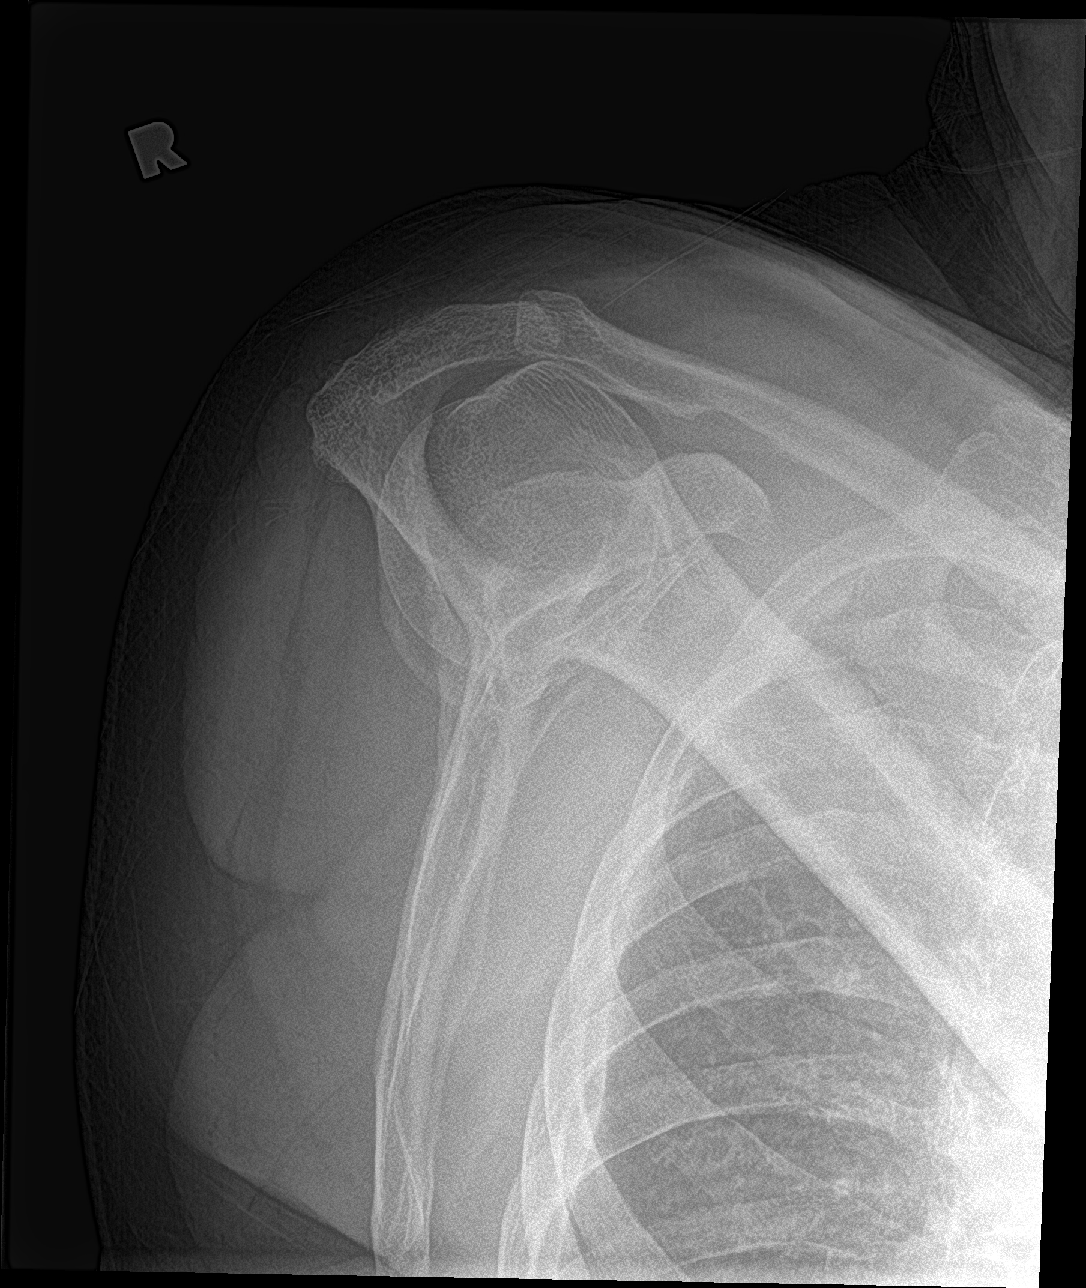

[shoulder axillary]
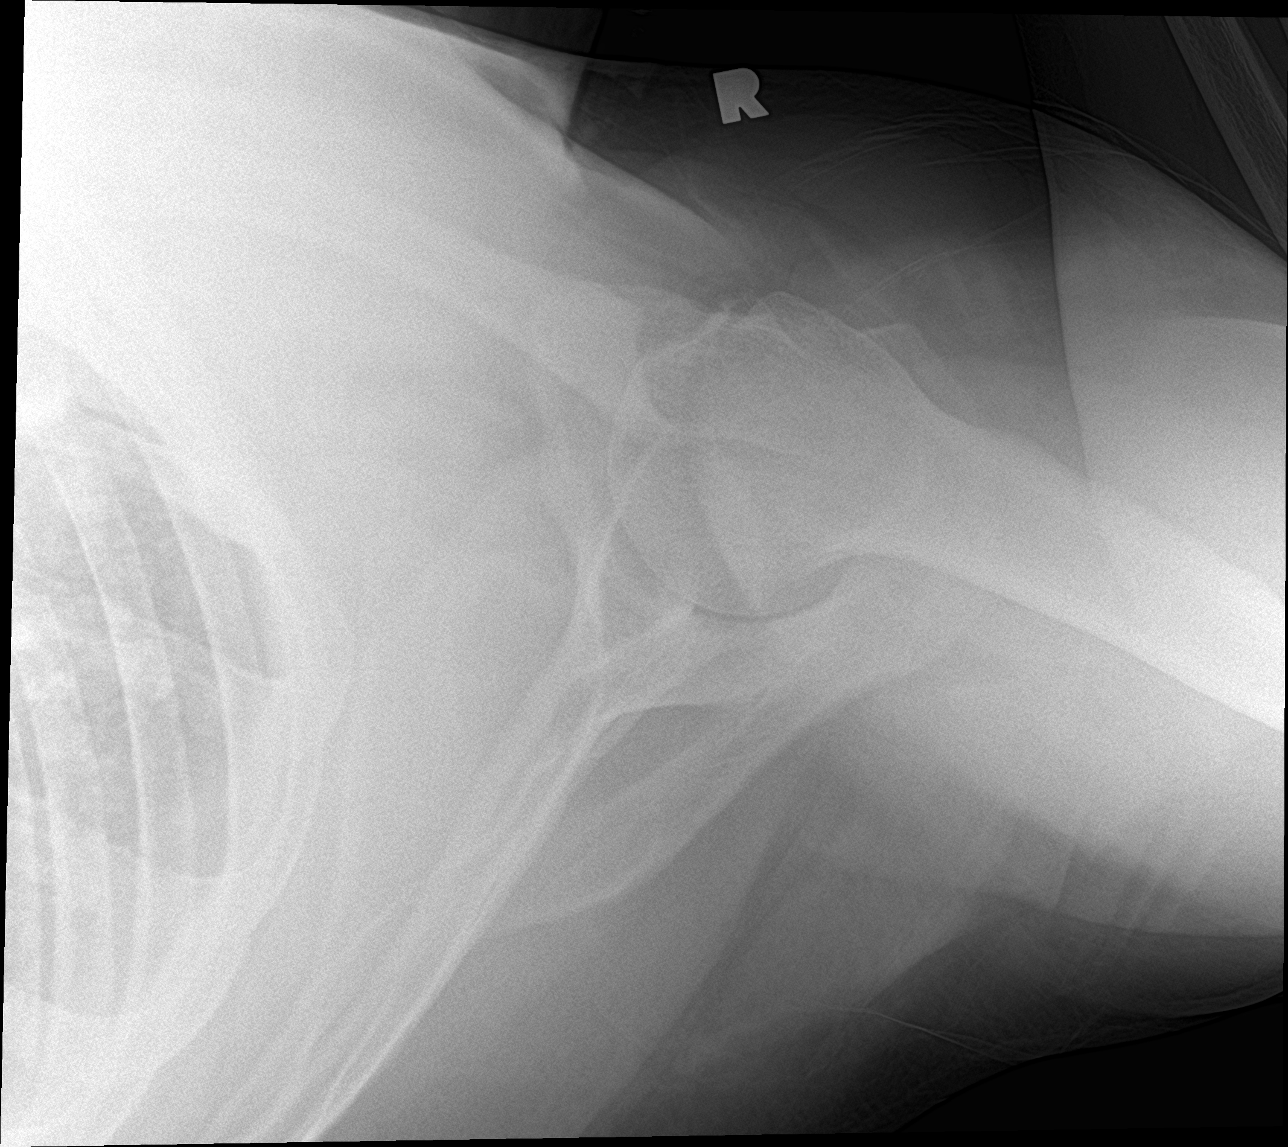

[3 of 3 positions shown; findings below may reference images not displayed]

FINDINGS: No acute fracture or dislocation identified. Mild narrowing of the
glenohumeral joint. Soft tissues are grossly unremarkable.
IMPRESSION: No acute osseous abnormality identified.

## 2022-08-19 ENCOUNTER — Ambulatory Visit (INDEPENDENT_AMBULATORY_CARE_PROVIDER_SITE_OTHER): Payer: Commercial Managed Care - PPO | Admitting: Family Medicine

## 2022-08-19 ENCOUNTER — Encounter: Payer: Self-pay | Admitting: Family Medicine

## 2022-08-19 VITALS — BP 135/79 | HR 52 | Ht 72.0 in | Wt 252.8 lb

## 2022-08-19 DIAGNOSIS — E782 Mixed hyperlipidemia: Secondary | ICD-10-CM

## 2022-08-19 DIAGNOSIS — Z0000 Encounter for general adult medical examination without abnormal findings: Secondary | ICD-10-CM | POA: Diagnosis not present

## 2022-08-19 DIAGNOSIS — I10 Essential (primary) hypertension: Secondary | ICD-10-CM | POA: Diagnosis not present

## 2022-08-19 DIAGNOSIS — N401 Enlarged prostate with lower urinary tract symptoms: Secondary | ICD-10-CM

## 2022-08-19 DIAGNOSIS — E559 Vitamin D deficiency, unspecified: Secondary | ICD-10-CM | POA: Diagnosis not present

## 2022-08-19 DIAGNOSIS — R350 Frequency of micturition: Secondary | ICD-10-CM

## 2022-08-19 MED ORDER — TAMSULOSIN HCL 0.4 MG PO CAPS: 0.4000 mg | ORAL_CAPSULE | Freq: Every day | ORAL | 3 refills | Status: DC

## 2022-08-19 MED ORDER — OMEPRAZOLE 40 MG PO CPDR: 40.0000 mg | DELAYED_RELEASE_CAPSULE | Freq: Every day | ORAL | 3 refills | Status: DC

## 2022-08-19 MED ORDER — ATORVASTATIN CALCIUM 20 MG PO TABS: ORAL_TABLET | ORAL | 3 refills | Status: DC

## 2022-08-19 NOTE — Progress Notes (Signed)
Trevor Guerrero - 62 y.o. male MRN [id number]  Date of birth: October 20, 1960  Subjective Chief Complaint  Patient presents with   Annual Exam   Hypertension    HPI Trevor Guerrero is a 62 y.o. male here today for annual exam.   He reports that he is doing well.  Has a skin lesion on his back that he wants checked.   He is doing well with current medications.   He stays fairly active.  He feels that diet is pretty good.   He denies tobacco use at this time. He denies EtOH.   Review of Systems  Constitutional:  Negative for chills, fever, malaise/fatigue and weight loss.  HENT:  Negative for congestion, ear pain and sore throat.   Eyes:  Negative for blurred vision, double vision and pain.  Respiratory:  Negative for cough and shortness of breath.   Cardiovascular:  Negative for chest pain and palpitations.  Gastrointestinal:  Negative for abdominal pain, blood in stool, constipation, heartburn and nausea.  Genitourinary:  Negative for dysuria and urgency.  Musculoskeletal:  Negative for joint pain and myalgias.  Neurological:  Negative for dizziness and headaches.  Endo/Heme/Allergies:  Does not bruise/bleed easily.  Psychiatric/Behavioral:  Negative for depression. The patient is not nervous/anxious and does not have insomnia.     No Known Allergies  Past Medical History:  Diagnosis Date   Erectile dysfunction 03/28/2014   Essential hypertension 02/28/2015   Family history of alpha 1 antitrypsin deficiency 12/07/2017   First degree AV block 05/18/2015   Normal 48 Hour Holter Monitor May 2017    Heterozygous alpha 1-antitrypsin deficiency (HCC) 12/17/2017   Level at 75.  Less than 57 is considered severe   Hyperlipidemia 11/16/2013   AHA 10 year risk of 9.0% Nov 2016    Solitary pulmonary nodule 11/24/2013   In addition, there is a non calcified right middle lobe nodule measuring 4-5 mm. This may reflect a noncalcified granuloma. No follow-up needed if patient is low-risk.  Non-contrast chest CT can be considered in 12 months if patient is high-risk  Plan to repeat CT in 1 year    Past Surgical History:  Procedure Laterality Date   APPENDECTOMY     left testicular mass Left     Social History   Socioeconomic History   Marital status: Married    Spouse name: Not on file   Number of children: Not on file   Years of education: Not on file   Highest education level: Not on file  Occupational History   Not on file  Tobacco Use   Smoking status: Former    Current packs/day: 0.00    Average packs/day: 0.3 packs/day for 2.0 years (0.5 ttl pk-yrs)    Types: Cigarettes    Start date: 11/15/1977    Quit date: 11/16/1979    Years since quitting: 42.7   Smokeless tobacco: Never  Substance and Sexual Activity   Alcohol use: No    Alcohol/week: 0.0 standard drinks of alcohol   Drug use: No   Sexual activity: Yes    Partners: Female  Other Topics Concern   Not on file  Social History Narrative   Not on file   Social Determinants of Health   Financial Resource Strain: Not on file  Food Insecurity: No Food Insecurity (07/01/2022)   Received from [facility], [facility]   Hunger Vital Sign    Worried About Running Out of Food in the Last Year: Never true  Ran Out of Food in the Last Year: Never true  Transportation Needs: No Transportation Needs (07/01/2022)   Received from [facility], [facility]   PRAPARE - Transportation    Lack of Transportation (Medical): No    Lack of Transportation (Non-Medical): No  Physical Activity: Not on file  Stress: No Stress Concern Present (07/01/2022)   Received from [facility], [facility] - Occupational Stress Questionnaire    Feeling of Stress : Not at all  Social Connections: Unknown (05/17/2021)   Received from [facility], [facility]   Social Network    Social Network: Not on file    Family History  Problem Relation Age of Onset   Alpha-1  antitrypsin deficiency Sister    Asthma Neg Hx    Heart failure Neg Hx    Hypertension Neg Hx     Health Maintenance  Topic Date Due   COVID-19 Vaccine (3 - 2023-24 season) 10/20/2022 (Originally 09/06/2021)   Zoster Vaccines- Shingrix (1 of 2) 11/19/2022 (Originally 09/15/2009)   INFLUENZA VACCINE  04/06/2023 (Originally 08/07/2022)   Colonoscopy  01/03/2024   DTaP/Tdap/Td (4 - Td or Tdap) 12/03/2026   Hepatitis C Screening  Completed   HIV Screening  Completed   HPV VACCINES  Aged Out     ----------------------------------------------------------------------------------------------------------------------------------------------------------------------------------------------------------------- Physical Exam BP 135/79   Pulse (!) 52   Ht 6' (1.829 m)   Wt 252 lb 12.8 oz (114.7 kg)   SpO2 98%   BMI 34.29 kg/m   Physical Exam Constitutional:      General: He is not in acute distress. HENT:     Head: Normocephalic and atraumatic.     Right Ear: Tympanic membrane and external ear normal.     Left Ear: Tympanic membrane and external ear normal.  Eyes:     General: No scleral icterus. Neck:     Thyroid: No thyromegaly.  Cardiovascular:     Rate and Rhythm: Normal rate and regular rhythm.     Heart sounds: Normal heart sounds.  Pulmonary:     Effort: Pulmonary effort is normal.     Breath sounds: Normal breath sounds.  Abdominal:     General: Bowel sounds are normal. There is no distension.     Palpations: Abdomen is soft.     Tenderness: There is no abdominal tenderness. There is no guarding.  Musculoskeletal:     Cervical back: Normal range of motion.  Lymphadenopathy:     Cervical: No cervical adenopathy.  Skin:    General: Skin is warm and dry.     Findings: No rash.  Neurological:     Mental Status: He is alert and oriented to person, place, and time.     Cranial Nerves: No cranial nerve deficit.     Motor: No abnormal muscle tone.  Psychiatric:        Mood  and Affect: Mood normal.        Behavior: Behavior normal.     ------------------------------------------------------------------------------------------------------------------------------------------------------------------------------------------------------------------- Assessment and Plan  Well adult exam Well adult Orders Placed This Encounter  Procedures   CMP14+EGFR   CBC with Differential   TSH   PSA   Lipid Panel With LDL/HDL Ratio   Vitamin D (25 hydroxy)  Screenings:  per lab orders  Immunizations: Declines Shingrix and Flu vaccine Anticipatory guidance/Risk factor reduction:  Recommendations per AVS.    Meds ordered this encounter  Medications   atorvastatin (LIPITOR) 20 MG tablet    Sig:  TAKE 1 TABLET(20 MG) BY MOUTH DAILY    Dispense:  90 tablet    Refill:  3   omeprazole (PRILOSEC) 40 MG capsule    Sig: Take 1 capsule (40 mg total) by mouth daily.    Dispense:  90 capsule    Refill:  3   tamsulosin (FLOMAX) 0.4 MG CAPS capsule    Sig: Take 1 capsule (0.4 mg total) by mouth daily.    Dispense:  90 capsule    Refill:  3    Return in about 6 months (around 02/19/2023) for HTN.    This visit occurred during the SARS-CoV-2 public health emergency.  Safety protocols were in place, including screening questions prior to the visit, additional usage of staff PPE, and extensive cleaning of exam room while observing appropriate contact time as indicated for disinfecting solutions.

## 2022-08-19 NOTE — Patient Instructions (Signed)

## 2022-08-19 NOTE — Assessment & Plan Note (Signed)
Well adult Orders Placed This Encounter  Procedures   CMP14+EGFR   CBC with Differential   TSH   PSA   Lipid Panel With LDL/HDL Ratio   Vitamin D (25 hydroxy)  Screenings:  per lab orders  Immunizations: Declines Shingrix and Flu vaccine Anticipatory guidance/Risk factor reduction:  Recommendations per AVS.

## 2022-09-24 ENCOUNTER — Other Ambulatory Visit: Payer: Self-pay

## 2022-09-24 MED ORDER — OMEPRAZOLE 40 MG PO CPDR: 40.0000 mg | DELAYED_RELEASE_CAPSULE | Freq: Every day | ORAL | 3 refills | Status: DC

## 2023-02-05 ENCOUNTER — Encounter: Payer: Self-pay | Admitting: Family Medicine

## 2023-02-05 ENCOUNTER — Ambulatory Visit: Payer: Commercial Managed Care - PPO | Admitting: Family Medicine

## 2023-02-05 VITALS — BP 158/75 | HR 58 | Ht 72.0 in | Wt 251.0 lb

## 2023-02-05 DIAGNOSIS — G5602 Carpal tunnel syndrome, left upper limb: Secondary | ICD-10-CM | POA: Diagnosis not present

## 2023-02-05 DIAGNOSIS — Z23 Encounter for immunization: Secondary | ICD-10-CM | POA: Diagnosis not present

## 2023-02-05 MED ORDER — MELOXICAM 15 MG PO TABS: 15.0000 mg | ORAL_TABLET | Freq: Every day | ORAL | 0 refills | Status: DC

## 2023-02-05 NOTE — Progress Notes (Signed)
Trevor Guerrero - 63 y.o. male MRN [id number]  Date of birth: [birth year]  Subjective Chief Complaint  Patient presents with   Wrist Pain    HPI Trevor Guerrero is a 63 y.o. right-handed male here today with complaint of of left wrist pain.  Pain radiates into the hand in the palm, first, second and third digit.  He does have some associated numbness and tingling.  He has not noted any significant weakness in grip strength.  Has not really tried anything other than a brace at times.  ROS:  A comprehensive ROS was completed and negative except as noted per HPI  No Known Allergies  Past Medical History:  Diagnosis Date   Erectile dysfunction 03/28/2014   Essential hypertension 02/28/2015   Family history of alpha 1 antitrypsin deficiency 12/07/2017   First degree AV block 05/18/2015   Normal 48 Hour Holter Monitor May 2017    Heterozygous alpha 1-antitrypsin deficiency (HCC) 12/17/2017   Level at 75.  Less than 57 is considered severe   Hyperlipidemia 11/16/2013   AHA 10 year risk of 9.0% Nov 2016    Solitary pulmonary nodule 11/24/2013   In addition, there is a non calcified right middle lobe nodule measuring 4-5 mm. This may reflect a noncalcified granuloma. No follow-up needed if patient is low-risk. Non-contrast chest CT can be considered in 12 months if patient is high-risk  Plan to repeat CT in 1 year    Past Surgical History:  Procedure Laterality Date   APPENDECTOMY     left testicular mass Left     Social History   Socioeconomic History   Marital status: Married    Spouse name: Not on file   Number of children: Not on file   Years of education: Not on file   Highest education level: Not on file  Occupational History   Not on file  Tobacco Use   Smoking status: Former    Current packs/day: 0.00    Average packs/day: 0.3 packs/day for 2.0 years (0.5 ttl pk-yrs)    Types: Cigarettes    Start date: 11/15/1977    Quit date: 11/16/1979    Years since quitting:  43.2   Smokeless tobacco: Never  Substance and Sexual Activity   Alcohol use: No    Alcohol/week: 0.0 standard drinks of alcohol   Drug use: No   Sexual activity: Yes    Partners: Female  Other Topics Concern   Not on file  Social History Narrative   Not on file   Social Drivers of Health   Financial Resource Strain: Not on file  Food Insecurity: No Food Insecurity (07/01/2022)   Received from [facility], [facility]   Hunger Vital Sign    Worried About Running Out of Food in the Last Year: Never true    Ran Out of Food in the Last Year: Never true  Transportation Needs: No Transportation Needs (07/01/2022)   Received from [facility], [facility]   PRAPARE - Transportation    Lack of Transportation (Medical): No    Lack of Transportation (Non-Medical): No  Physical Activity: Not on file  Stress: No Stress Concern Present (07/01/2022)   Received from [facility], [facility] - Occupational Stress Questionnaire    Feeling of Stress : Not at all  Social Connections: Unknown (05/17/2021)   Received from [facility], [facility]   Social Network    Social Network: Not on file  Family History  Problem Relation Age of Onset   Alpha-1 antitrypsin deficiency Sister    Asthma Neg Hx    Heart failure Neg Hx    Hypertension Neg Hx     Health Maintenance  Topic Date Due   COVID-19 Vaccine (3 - 2024-25 season) 02/21/2024 (Originally 09/07/2022)   Zoster Vaccines- Shingrix (1 of 2) 05/05/2024 (Originally 09/15/2009)   Colonoscopy  01/03/2024   DTaP/Tdap/Td (4 - Td or Tdap) 12/03/2026   INFLUENZA VACCINE  Completed   Hepatitis C Screening  Completed   HIV Screening  Completed   HPV VACCINES  Aged Out     ----------------------------------------------------------------------------------------------------------------------------------------------------------------------------------------------------------------- Physical  Exam BP (!) 158/75 (BP Location: Right Arm, Patient Position: Sitting, Cuff Size: Large)   Pulse (!) 58   Ht 6' (1.829 m)   Wt 251 lb (113.9 kg)   SpO2 98%   BMI 34.04 kg/m   Physical Exam Constitutional:      Appearance: Normal appearance.  HENT:     Head: Normocephalic and atraumatic.  Musculoskeletal:     Comments: Tenderness to palpation along the medial wrist and proximal palm.  Positive Phalen's test.  Grip strength is normal.  No thenar or hypothenar wasting.  Neurological:     Mental Status: He is alert.     ------------------------------------------------------------------------------------------------------------------------------------------------------------------------------------------------------------------- Assessment and Plan  Carpal tunnel syndrome of left wrist Symptoms and exam are consistent with carpal tunnel syndrome of the left wrist.  Recommend using bracing especially at night.  Adding meloxicam as needed.  Referral placed to hand surgeon as he would like to pursue surgical intervention for this.   Meds ordered this encounter  Medications   meloxicam (MOBIC) 15 MG tablet    Sig: Take 1 tablet (15 mg total) by mouth daily.    Dispense:  30 tablet    Refill:  0    No follow-ups on file.    This visit occurred during the SARS-CoV-2 public health emergency.  Safety protocols were in place, including screening questions prior to the visit, additional usage of staff PPE, and extensive cleaning of exam room while observing appropriate contact time as indicated for disinfecting solutions.

## 2023-02-05 NOTE — Assessment & Plan Note (Signed)
Symptoms and exam are consistent with carpal tunnel syndrome of the left wrist.  Recommend using bracing especially at night.  Adding meloxicam as needed.  Referral placed to hand surgeon as he would like to pursue surgical intervention for this.

## 2023-02-19 ENCOUNTER — Ambulatory Visit: Payer: Commercial Managed Care - PPO | Admitting: Family Medicine

## 2023-03-23 ENCOUNTER — Other Ambulatory Visit: Payer: Self-pay | Admitting: Family Medicine

## 2023-03-25 ENCOUNTER — Other Ambulatory Visit: Payer: Self-pay | Admitting: Family Medicine

## 2023-06-18 ENCOUNTER — Ambulatory Visit: Admitting: Family Medicine

## 2023-06-30 ENCOUNTER — Encounter: Payer: Self-pay | Admitting: Family Medicine

## 2023-06-30 ENCOUNTER — Ambulatory Visit (INDEPENDENT_AMBULATORY_CARE_PROVIDER_SITE_OTHER): Admitting: Family Medicine

## 2023-06-30 VITALS — BP 132/80 | HR 55 | Ht 72.0 in | Wt 247.0 lb

## 2023-06-30 DIAGNOSIS — E559 Vitamin D deficiency, unspecified: Secondary | ICD-10-CM

## 2023-06-30 DIAGNOSIS — I10 Essential (primary) hypertension: Secondary | ICD-10-CM | POA: Diagnosis not present

## 2023-06-30 DIAGNOSIS — N401 Enlarged prostate with lower urinary tract symptoms: Secondary | ICD-10-CM

## 2023-06-30 DIAGNOSIS — R350 Frequency of micturition: Secondary | ICD-10-CM

## 2023-06-30 DIAGNOSIS — E782 Mixed hyperlipidemia: Secondary | ICD-10-CM | POA: Diagnosis not present

## 2023-06-30 MED ORDER — LOSARTAN POTASSIUM 25 MG PO TABS: 25.0000 mg | ORAL_TABLET | Freq: Every day | ORAL | 1 refills | Status: DC

## 2023-06-30 NOTE — Assessment & Plan Note (Signed)
 Stable symptoms with Flomax .  Update PSA.

## 2023-06-30 NOTE — Progress Notes (Signed)
 Trevor Guerrero - 63 y.o. male MRN [id number]  Date of birth: 05/14/1960  Subjective Chief Complaint  Patient presents with   Hypertension    HPI Trevor Guerrero is a 63 y.o. male here today for follow up visit.   He reports that he is doing pretty well. He has been out of work due to having surgery on his hand and elbow a few months ago.  He is recovering well from this.  Blood pressure mains well-controlled with losartan.  Denies side effects at current strength.  He has not had chest pain, shortness of breath, palpitations, headaches or vision changes.  Urinary symptoms remain well-controlled with Flomax .  ROS:  A comprehensive ROS was completed and negative except as noted per HPI   No Known Allergies  Past Medical History:  Diagnosis Date   Erectile dysfunction 03/28/2014   Essential hypertension 02/28/2015   Family history of alpha 1 antitrypsin deficiency 12/07/2017   First degree AV block 05/18/2015   Normal 48 Hour Holter Monitor May 2017    Heterozygous alpha 1-antitrypsin deficiency (HCC) 12/17/2017   Level at 75.  Less than 57 is considered severe   Hyperlipidemia 11/16/2013   AHA 10 year risk of 9.0% Nov 2016    Solitary pulmonary nodule 11/24/2013   In addition, there is a non calcified right middle lobe nodule measuring 4-5 mm. This may reflect a noncalcified granuloma. No follow-up needed if patient is low-risk. Non-contrast chest CT can be considered in 12 months if patient is high-risk  Plan to repeat CT in 1 year    Past Surgical History:  Procedure Laterality Date   APPENDECTOMY     left testicular mass Left     Social History   Socioeconomic History   Marital status: Married    Spouse name: Not on file   Number of children: Not on file   Years of education: Not on file   Highest education level: Not on file  Occupational History   Not on file  Tobacco Use   Smoking status: Former    Current packs/day: 0.00    Average packs/day: 0.3  packs/day for 2.0 years (0.5 ttl pk-yrs)    Types: Cigarettes    Start date: 11/15/1977    Quit date: 11/16/1979    Years since quitting: 43.6   Smokeless tobacco: Never  Substance and Sexual Activity   Alcohol use: No    Alcohol/week: 0.0 standard drinks of alcohol   Drug use: No   Sexual activity: Yes    Partners: Female  Other Topics Concern   Not on file  Social History Narrative   Not on file   Social Drivers of Health   Financial Resource Strain: Low Risk  (06/30/2023)   Overall Financial Resource Strain (CARDIA)    Difficulty of Paying Living Expenses: Not hard at all  Food Insecurity: No Food Insecurity (06/30/2023)   Hunger Vital Sign    Worried About Running Out of Food in the Last Year: Never true    Ran Out of Food in the Last Year: Never true  Transportation Needs: No Transportation Needs (06/30/2023)   PRAPARE - Administrator, Civil Service (Medical): No    Lack of Transportation (Non-Medical): No  Physical Activity: Sufficiently Active (06/30/2023)   Exercise Vital Sign    Days of Exercise per Week: 3 days    Minutes of Exercise per Session: 60 min  Stress: No Stress Concern Present (06/30/2023)   [employer] of  Occupational Health - Occupational Stress Questionnaire    Feeling of Stress: Not at all  Social Connections: Socially Integrated (06/30/2023)   Social Connection and Isolation Panel    Frequency of Communication with Friends and Family: More than three times a week    Frequency of Social Gatherings with Friends and Family: Twice a week    Attends Religious Services: More than 4 times per year    Active Member of Clubs or Organizations: Yes    Attends Engineer, structural: More than 4 times per year    Marital Status: Married    Family History  Problem Relation Age of Onset   Alpha-1 antitrypsin deficiency Sister    Asthma Neg Hx    Heart failure Neg Hx    Hypertension Neg Hx     Health Maintenance  Topic Date Due    COVID-19 Vaccine (3 - 2024-25 season) 02/21/2024 (Originally 09/07/2022)   Zoster Vaccines- Shingrix (1 of 2) 05/05/2024 (Originally 09/15/2009)   INFLUENZA VACCINE  08/07/2023   Colonoscopy  01/03/2024   DTaP/Tdap/Td (4 - Td or Tdap) 12/03/2026   Hepatitis C Screening  Completed   HIV Screening  Completed   Hepatitis B Vaccines  Aged Out   HPV VACCINES  Aged Out   Meningococcal B Vaccine  Aged Out     ----------------------------------------------------------------------------------------------------------------------------------------------------------------------------------------------------------------- Physical Exam BP 132/80   Pulse (!) 55   Ht 6' (1.829 m)   Wt 247 lb (112 kg)   SpO2 97%   BMI 33.50 kg/m   Physical Exam Constitutional:      Appearance: Normal appearance.   Eyes:     General: No scleral icterus.   Cardiovascular:     Rate and Rhythm: Normal rate and regular rhythm.  Pulmonary:     Effort: Pulmonary effort is normal.     Breath sounds: Normal breath sounds.   Musculoskeletal:     Cervical back: Neck supple.   Neurological:     Mental Status: He is alert.   Psychiatric:        Mood and Affect: Mood normal.        Behavior: Behavior normal.     ------------------------------------------------------------------------------------------------------------------------------------------------------------------------------------------------------------------- Assessment and Plan  Hyperlipidemia He has done well with atorvastatin  at current strength.  Recommend continuation.  Updating lipid panel.  Essential hypertension Blood pressure is well-controlled.  Continue losartan at current strength.  Benign prostatic hyperplasia with urinary frequency Stable symptoms with Flomax .  Update PSA.   Meds ordered this encounter  Medications   losartan (COZAAR) 25 MG tablet    Sig: Take 1 tablet (25 mg total) by mouth daily.    Dispense:  90 tablet     Refill:  1    Return in about 2 weeks (around 07/14/2023) for nurse visit for BP check/BMP.

## 2023-06-30 NOTE — Assessment & Plan Note (Signed)
 Blood pressure is well-controlled.  Continue losartan at current strength.

## 2023-06-30 NOTE — Assessment & Plan Note (Signed)
 He has done well with atorvastatin  at current strength.  Recommend continuation.  Updating lipid panel.

## 2023-07-01 LAB — CMP14+EGFR
ALT: 53 IU/L — ABNORMAL HIGH (ref 0–44)
AST: 32 IU/L (ref 0–40)
Albumin: 4.7 g/dL (ref 3.9–4.9)
Alkaline Phosphatase: 108 IU/L (ref 44–121)
BUN/Creatinine Ratio: 21 (ref 10–24)
BUN: 20 mg/dL (ref 8–27)
Bilirubin Total: 0.6 mg/dL (ref 0.0–1.2)
CO2: 19 mmol/L — ABNORMAL LOW (ref 20–29)
Calcium: 10.4 mg/dL — ABNORMAL HIGH (ref 8.6–10.2)
Chloride: 104 mmol/L (ref 96–106)
Creatinine, Ser: 0.96 mg/dL (ref 0.76–1.27)
Globulin, Total: 2 g/dL (ref 1.5–4.5)
Glucose: 107 mg/dL — ABNORMAL HIGH (ref 70–99)
Potassium: 4.5 mmol/L (ref 3.5–5.2)
Sodium: 139 mmol/L (ref 134–144)
Total Protein: 6.7 g/dL (ref 6.0–8.5)
eGFR: 89 mL/min/{1.73_m2} (ref >59)

## 2023-07-01 LAB — CBC WITH DIFFERENTIAL/PLATELET
Basophils Absolute: 0 10*3/uL (ref 0.0–0.2)
Basos: 1 %
EOS (ABSOLUTE): 0.1 10*3/uL (ref 0.0–0.4)
Eos: 2 %
Hematocrit: 44.7 % (ref 37.5–51.0)
Hemoglobin: 14.3 g/dL (ref 13.0–17.7)
Immature Grans (Abs): 0 10*3/uL (ref 0.0–0.1)
Immature Granulocytes: 0 %
Lymphocytes Absolute: 1.6 10*3/uL (ref 0.7–3.1)
Lymphs: 31 %
MCH: 29.7 pg (ref 26.6–33.0)
MCHC: 32 g/dL (ref 31.5–35.7)
MCV: 93 fL (ref 79–97)
Monocytes Absolute: 0.6 10*3/uL (ref 0.1–0.9)
Monocytes: 11 %
Neutrophils Absolute: 2.9 10*3/uL (ref 1.4–7.0)
Neutrophils: 55 %
Platelets: 279 10*3/uL (ref 150–450)
RBC: 4.81 x10E6/uL (ref 4.14–5.80)
RDW: 12.9 % (ref 11.6–15.4)
WBC: 5.2 10*3/uL (ref 3.4–10.8)

## 2023-07-01 LAB — LIPID PANEL
Chol/HDL Ratio: 3.5 ratio (ref 0.0–5.0)
Cholesterol, Total: 118 mg/dL (ref 100–199)
HDL: 34 mg/dL — ABNORMAL LOW (ref >39)
LDL Chol Calc (NIH): 66 mg/dL (ref 0–99)
Triglycerides: 95 mg/dL (ref 0–149)
VLDL Cholesterol Cal: 18 mg/dL (ref 5–40)

## 2023-07-01 LAB — VITAMIN D 25 HYDROXY (VIT D DEFICIENCY, FRACTURES): Vit D, 25-Hydroxy: 25.2 ng/mL — ABNORMAL LOW (ref 30.0–100.0)

## 2023-07-01 LAB — PSA: Prostate Specific Ag, Serum: 0.3 ng/mL (ref 0.0–4.0)

## 2023-07-12 ENCOUNTER — Ambulatory Visit: Payer: Self-pay | Admitting: Family Medicine

## 2023-07-14 ENCOUNTER — Ambulatory Visit (INDEPENDENT_AMBULATORY_CARE_PROVIDER_SITE_OTHER)

## 2023-07-14 VITALS — BP 151/82 | HR 60

## 2023-07-14 DIAGNOSIS — I10 Essential (primary) hypertension: Secondary | ICD-10-CM | POA: Diagnosis not present

## 2023-07-14 NOTE — Progress Notes (Signed)
   Established Patient Office Visit  Subjective   Patient ID: Trevor Guerrero, male    DOB: 1960-07-10  Age: 63 y.o. MRN: [id number]  Chief Complaint  Patient presents with   Hypertension    HPI  Trevor Guerrero is here for blood pressure check. Denies chest pain, shortness of breath or dizziness.   ROS    Objective:     BP (!) 151/82   Pulse 60   SpO2 99%    Physical Exam   No results found for any visits on 07/14/23.    The ASCVD Risk score (Arnett DK, et al., 2019) failed to calculate for the following reasons:   The valid total cholesterol range is 130 to 320 mg/dL    Assessment & Plan:  Hypertension - Per Dr Alvia patient advised to increase the Losartan  25 mg to 50 mg. Can take 2 tablets of the 25 mg. Return in 2 weeks for nurse visit blood pressure check.    Problem List Items Addressed This Visit       Unprioritized   Essential hypertension - Primary (Chronic)    Return in about 2 weeks (around 07/28/2023) for nurse visit blood pressure check. Trevor Guerrero, Trevor Guerrero, CMA

## 2023-07-16 ENCOUNTER — Other Ambulatory Visit: Payer: Self-pay

## 2023-07-16 MED ORDER — LOSARTAN POTASSIUM 50 MG PO TABS: 50.0000 mg | ORAL_TABLET | Freq: Every day | ORAL | 0 refills | Status: DC

## 2023-07-16 NOTE — Telephone Encounter (Signed)
 Copied from CRM [id number]. Topic: Clinical - Prescription Issue >> Jul 15, 2023  1:22 PM Susanna ORN wrote: Reason for CRM: Patient states he was in yesterday and saw Dr. Alvia & nurse for a blood pressure evaluation. He states Dr. Alvia wanted him to double his medication and he told him that he has medication at home. Patient states he checked and realized that he does not have enough medication and wants to know if Dr. Alvia can send another prescription to the pharmacy with directions on what he wants to do. This is for the medication losartan  (COZAAR ) 25 MG tablet.

## 2023-07-16 NOTE — Telephone Encounter (Signed)
 In last clinical support visit . It looks like losartan  was to be increased to 50mg  .  Pended this prescription

## 2023-07-28 ENCOUNTER — Ambulatory Visit (INDEPENDENT_AMBULATORY_CARE_PROVIDER_SITE_OTHER)

## 2023-07-28 VITALS — BP 133/75 | HR 57 | Ht 72.0 in | Wt 249.2 lb

## 2023-07-28 DIAGNOSIS — I10 Essential (primary) hypertension: Secondary | ICD-10-CM

## 2023-07-28 NOTE — Progress Notes (Signed)
   Established Patient Office Visit  Subjective   Patient ID: Trevor Guerrero, male    DOB: March 28, 1960  Age: 63 y.o. MRN: [id number]  Chief Complaint  Patient presents with   Hypertension    BP check nurse visit    HPI  Hypertension-BP check nurse visit. Patient denies shortness of breath, dizziness, palpitations,  headaches, vision changes or medication problems.   ROS    Objective:     BP 133/75 (BP Location: Left Arm, Patient Position: Sitting, Cuff Size: Large)   Pulse (!) 57   Ht 6' (1.829 m)   Wt 249 lb 3 oz (113 kg)   SpO2 100%   BMI 33.80 kg/m    Physical Exam   No results found for any visits on 07/28/23.    The ASCVD Risk score (Arnett DK, et al., 2019) failed to calculate for the following reasons:   The valid total cholesterol range is 130 to 320 mg/dL    Assessment & Plan:  BP reading = 133/75. Per Dr. Alvia continue current medication regimen. Return at next scheduled appt on 12/29/2023 with DR. Alvia for HTN follow up.  Problem List Items Addressed This Visit       Cardiovascular and Mediastinum   Essential hypertension - Primary (Chronic)    Return in about 22 weeks (around 12/29/2023) for follow up with Dr. Alvia.    Suzen SHAUNNA Plenty, LPN

## 2023-07-28 NOTE — Patient Instructions (Signed)
 Return as already scheduled for 12/29/2023 with Dr. Alvia.

## 2023-09-02 ENCOUNTER — Other Ambulatory Visit: Payer: Self-pay | Admitting: Family Medicine

## 2023-09-28 ENCOUNTER — Encounter: Payer: Self-pay | Admitting: Family Medicine

## 2023-09-28 ENCOUNTER — Ambulatory Visit: Admitting: Family Medicine

## 2023-09-28 VITALS — BP 126/78 | HR 62 | Ht 72.0 in | Wt 251.0 lb

## 2023-09-28 DIAGNOSIS — E782 Mixed hyperlipidemia: Secondary | ICD-10-CM | POA: Diagnosis not present

## 2023-09-28 DIAGNOSIS — I10 Essential (primary) hypertension: Secondary | ICD-10-CM

## 2023-09-28 DIAGNOSIS — Z23 Encounter for immunization: Secondary | ICD-10-CM

## 2023-09-28 DIAGNOSIS — Z1211 Encounter for screening for malignant neoplasm of colon: Secondary | ICD-10-CM

## 2023-09-28 DIAGNOSIS — K219 Gastro-esophageal reflux disease without esophagitis: Secondary | ICD-10-CM | POA: Diagnosis not present

## 2023-09-28 MED ORDER — LOSARTAN POTASSIUM 50 MG PO TABS: 50.0000 mg | ORAL_TABLET | Freq: Every day | ORAL | 3 refills | Status: AC

## 2023-09-28 MED ORDER — OMEPRAZOLE 40 MG PO CPDR: 40.0000 mg | DELAYED_RELEASE_CAPSULE | Freq: Every day | ORAL | 3 refills | Status: DC

## 2023-09-28 NOTE — Assessment & Plan Note (Signed)
 Blood pressure is well-controlled.  Continue losartan at current strength.

## 2023-09-28 NOTE — Progress Notes (Signed)
 Trevor Guerrero - 63 y.o. male MRN [id number]  Date of birth: September 17, 1960  Subjective Chief Complaint  Patient presents with   Hypertension    HPI Trevor Guerrero is a 63 y.o. male here today for follow up visit.   He reports that he is doing well.  He is approaching retirement and looking forward to this.    He continues on losartan  for management of HTN.  His initial BP is elevated today. He has had issues getting his refills from his pharmacy.  Denies chest pain, shortness of breath, palpitations, headache or vision changes.   Tolerating atorvastatin  well for management of HLD.    ROS:  A comprehensive ROS was completed and negative except as noted per HPI  No Known Allergies  Past Medical History:  Diagnosis Date   Erectile dysfunction 03/28/2014   Essential hypertension 02/28/2015   Family history of alpha 1 antitrypsin deficiency 12/07/2017   First degree AV block 05/18/2015   Normal 48 Hour Holter Monitor May 2017    Heterozygous alpha 1-antitrypsin deficiency (HCC) 12/17/2017   Level at 75.  Less than 57 is considered severe   Hyperlipidemia 11/16/2013   AHA 10 year risk of 9.0% Nov 2016    Solitary pulmonary nodule 11/24/2013   In addition, there is a non calcified right middle lobe nodule measuring 4-5 mm. This may reflect a noncalcified granuloma. No follow-up needed if patient is low-risk. Non-contrast chest CT can be considered in 12 months if patient is high-risk  Plan to repeat CT in 1 year    Past Surgical History:  Procedure Laterality Date   APPENDECTOMY     left testicular mass Left     Social History   Socioeconomic History   Marital status: Married    Spouse name: Not on file   Number of children: Not on file   Years of education: Not on file   Highest education level: Not on file  Occupational History   Not on file  Tobacco Use   Smoking status: Former    Current packs/day: 0.00    Average packs/day: 0.3 packs/day for 2.0 years (0.5 ttl  pk-yrs)    Types: Cigarettes    Start date: 11/15/1977    Quit date: 11/16/1979    Years since quitting: 43.8   Smokeless tobacco: Never  Substance and Sexual Activity   Alcohol use: No    Alcohol/week: 0.0 standard drinks of alcohol   Drug use: No   Sexual activity: Yes    Partners: Female  Other Topics Concern   Not on file  Social History Narrative   Not on file   Social Drivers of Health   Financial Resource Strain: Low Risk  (06/30/2023)   Overall Financial Resource Strain (CARDIA)    Difficulty of Paying Living Expenses: Not hard at all  Food Insecurity: No Food Insecurity (06/30/2023)   Hunger Vital Sign    Worried About Running Out of Food in the Last Year: Never true    Ran Out of Food in the Last Year: Never true  Transportation Needs: No Transportation Needs (06/30/2023)   PRAPARE - Administrator, Civil Service (Medical): No    Lack of Transportation (Non-Medical): No  Physical Activity: Sufficiently Active (06/30/2023)   Exercise Vital Sign    Days of Exercise per Week: 3 days    Minutes of Exercise per Session: 60 min  Stress: No Stress Concern Present (06/30/2023)   [employer] of Occupational Health -  Occupational Stress Questionnaire    Feeling of Stress: Not at all  Social Connections: Socially Integrated (06/30/2023)   Social Connection and Isolation Panel    Frequency of Communication with Friends and Family: More than three times a week    Frequency of Social Gatherings with Friends and Family: Twice a week    Attends Religious Services: More than 4 times per year    Active Member of Clubs or Organizations: Yes    Attends Engineer, structural: More than 4 times per year    Marital Status: Married    Family History  Problem Relation Age of Onset   Alpha-1 antitrypsin deficiency Sister    Asthma Neg Hx    Heart failure Neg Hx    Hypertension Neg Hx     Health Maintenance  Topic Date Due   Pneumococcal Vaccine: 50+ Years  (1 of 1 - PCV) Never done   COVID-19 Vaccine (3 - 2025-26 season) 09/07/2023   Colonoscopy  01/03/2024   Zoster Vaccines- Shingrix (1 of 2) 05/05/2024 (Originally 09/15/2009)   DTaP/Tdap/Td (4 - Td or Tdap) 12/03/2026   Influenza Vaccine  Completed   Hepatitis C Screening  Completed   HIV Screening  Completed   Hepatitis B Vaccines 19-59 Average Risk  Aged Out   HPV VACCINES  Aged Out   Meningococcal B Vaccine  Aged Out     ----------------------------------------------------------------------------------------------------------------------------------------------------------------------------------------------------------------- Physical Exam BP 126/78   Pulse 62   Ht 6' (1.829 m)   Wt 251 lb (113.9 kg)   SpO2 98%   BMI 34.04 kg/m   Physical Exam Constitutional:      Appearance: Normal appearance.  Cardiovascular:     Rate and Rhythm: Normal rate. Rhythm irregular.  Pulmonary:     Effort: Pulmonary effort is normal.     Breath sounds: Normal breath sounds.  Neurological:     General: No focal deficit present.     Mental Status: He is alert.  Psychiatric:        Mood and Affect: Mood normal.        Behavior: Behavior normal.     ------------------------------------------------------------------------------------------------------------------------------------------------------------------------------------------------------------------- Assessment and Plan  Hyperlipidemia He has done well with atorvastatin  at current strength.  Recommend continuation.   Essential hypertension Blood pressure is well-controlled.  Continue losartan  at current strength.  GERD (gastroesophageal reflux disease) Omeprazole  continues to work well for him.  Will continue at 40mg  daily.    Meds ordered this encounter  Medications   losartan  (COZAAR ) 50 MG tablet    Sig: Take 1 tablet (50 mg total) by mouth daily.    Dispense:  90 tablet    Refill:  3   omeprazole  (PRILOSEC) 40 MG  capsule    Sig: Take 1 capsule (40 mg total) by mouth daily.    Dispense:  90 capsule    Refill:  3    No follow-ups on file.

## 2023-09-28 NOTE — Assessment & Plan Note (Signed)
 Omeprazole  continues to work well for him.  Will continue at 40mg  daily.

## 2023-09-28 NOTE — Assessment & Plan Note (Signed)
He has done well with atorvastatin at current strength.  Recommend continuation.

## 2023-10-05 ENCOUNTER — Other Ambulatory Visit: Payer: Self-pay

## 2023-10-05 MED ORDER — OMEPRAZOLE 40 MG PO CPDR: 40.0000 mg | DELAYED_RELEASE_CAPSULE | Freq: Every day | ORAL | 3 refills | Status: AC

## 2023-12-29 ENCOUNTER — Encounter: Payer: Self-pay | Admitting: Family Medicine

## 2023-12-29 ENCOUNTER — Ambulatory Visit: Admitting: Family Medicine

## 2023-12-29 DIAGNOSIS — E782 Mixed hyperlipidemia: Secondary | ICD-10-CM

## 2023-12-29 DIAGNOSIS — I10 Essential (primary) hypertension: Secondary | ICD-10-CM

## 2023-12-29 NOTE — Assessment & Plan Note (Addendum)
 BP elevated today.  Return in 2 weeks for BP check.  Continue losartan  at current strength.

## 2023-12-29 NOTE — Assessment & Plan Note (Signed)
He has done well with atorvastatin at current strength.  Recommend continuation.

## 2023-12-29 NOTE — Progress Notes (Signed)
 " Ezequiel Macauley - 63 y.o. male MRN [id number]  Date of birth: 28-Oct-1960  Subjective Chief Complaint  Patient presents with   Medical Management of Chronic Issues    HTN    HPI Josealfredo Adkins is a 63 y.o. male here today for follow up visit.   He reports that he is doing very well.  Looking forward to upcoming retirement.   He continues on losartan  for management of HTN.  BP is mildly elevated this morning.  He has not taken his medication for today yet.  He denies symptoms related to HTN including chest pain, shortness of breath, palpitations, headache or vision changes.    ROS:  A comprehensive ROS was completed and negative except as noted per HPI  Allergies[1]  Past Medical History:  Diagnosis Date   Erectile dysfunction 03/28/2014   Essential hypertension 02/28/2015   Family history of alpha 1 antitrypsin deficiency 12/07/2017   First degree AV block 05/18/2015   Normal 48 Hour Holter Monitor May 2017    Heterozygous alpha 1-antitrypsin deficiency (HCC) 12/17/2017   Level at 75.  Less than 57 is considered severe   Hyperlipidemia 11/16/2013   AHA 10 year risk of 9.0% Nov 2016    Solitary pulmonary nodule 11/24/2013   In addition, there is a non calcified right middle lobe nodule measuring 4-5 mm. This may reflect a noncalcified granuloma. No follow-up needed if patient is low-risk. Non-contrast chest CT can be considered in 12 months if patient is high-risk  Plan to repeat CT in 1 year    Past Surgical History:  Procedure Laterality Date   APPENDECTOMY     left testicular mass Left     Social History   Socioeconomic History   Marital status: Married    Spouse name: Not on file   Number of children: Not on file   Years of education: Not on file   Highest education level: Not on file  Occupational History   Not on file  Tobacco Use   Smoking status: Former    Current packs/day: 0.00    Average packs/day: 0.3 packs/day for 2.0 years (0.5 ttl pk-yrs)    Types:  Cigarettes    Start date: 11/15/1977    Quit date: 11/16/1979    Years since quitting: 44.1   Smokeless tobacco: Never  Substance and Sexual Activity   Alcohol use: No    Alcohol/week: 0.0 standard drinks of alcohol   Drug use: No   Sexual activity: Yes    Partners: Female  Other Topics Concern   Not on file  Social History Narrative   Not on file   Social Drivers of Health   Tobacco Use: Medium Risk (12/29/2023)   Patient History    Smoking Tobacco Use: Former    Smokeless Tobacco Use: Never    Passive Exposure: Not on Actuary Strain: Low Risk (06/30/2023)   Overall Financial Resource Strain (CARDIA)    Difficulty of Paying Living Expenses: Not hard at all  Food Insecurity: No Food Insecurity (06/30/2023)   Epic    Worried About Radiation Protection Practitioner of Food in the Last Year: Never true    Ran Out of Food in the Last Year: Never true  Transportation Needs: No Transportation Needs (06/30/2023)   Epic    Lack of Transportation (Medical): No    Lack of Transportation (Non-Medical): No  Physical Activity: Sufficiently Active (06/30/2023)   Exercise Vital Sign    Days of Exercise per Week:  3 days    Minutes of Exercise per Session: 60 min  Stress: No Stress Concern Present (06/30/2023)   [employer] of Occupational Health - Occupational Stress Questionnaire    Feeling of Stress: Not at all  Social Connections: Socially Integrated (06/30/2023)   Social Connection and Isolation Panel    Frequency of Communication with Friends and Family: More than three times a week    Frequency of Social Gatherings with Friends and Family: Twice a week    Attends Religious Services: More than 4 times per year    Active Member of Clubs or Organizations: Yes    Attends Banker Meetings: More than 4 times per year    Marital Status: Married  Depression (PHQ2-9): Low Risk (12/29/2023)   Depression (PHQ2-9)    PHQ-2 Score: 0  Alcohol Screen: Low Risk (06/30/2023)    Alcohol Screen    Last Alcohol Screening Score (AUDIT): 0  Housing: Low Risk (06/30/2023)   Epic    Unable to Pay for Housing in the Last Year: No    Number of Times Moved in the Last Year: 0    Homeless in the Last Year: No  Utilities: Not At Risk (06/30/2023)   Epic    Threatened with loss of utilities: No  Health Literacy: Adequate Health Literacy (06/30/2023)   B1300 Health Literacy    Frequency of need for help with medical instructions: Never    Family History  Problem Relation Age of Onset   Alpha-1 antitrypsin deficiency Sister    Asthma Neg Hx    Heart failure Neg Hx    Hypertension Neg Hx     Health Maintenance  Topic Date Due   COVID-19 Vaccine (3 - 2025-26 season) 09/07/2023   Colonoscopy  01/03/2024   Zoster Vaccines- Shingrix (1 of 2) 05/05/2024 (Originally 09/15/2009)   DTaP/Tdap/Td (4 - Td or Tdap) 12/03/2026   Pneumococcal Vaccine: 50+ Years  Completed   Influenza Vaccine  Completed   Hepatitis C Screening  Completed   HIV Screening  Completed   Hepatitis B Vaccines 19-59 Average Risk  Aged Out   HPV VACCINES  Aged Out   Meningococcal B Vaccine  Aged Out     ----------------------------------------------------------------------------------------------------------------------------------------------------------------------------------------------------------------- Physical Exam BP (!) 150/76 (BP Location: Left Arm, Patient Position: Sitting, Cuff Size: Normal)   Pulse 62   Ht 6' (1.829 m)   Wt 253 lb (114.8 kg)   SpO2 99%   BMI 34.31 kg/m   Physical Exam Constitutional:      Appearance: Normal appearance.  Eyes:     General: No scleral icterus. Cardiovascular:     Rate and Rhythm: Normal rate and regular rhythm.  Pulmonary:     Effort: Pulmonary effort is normal.     Breath sounds: Normal breath sounds.  Neurological:     Mental Status: He is alert.  Psychiatric:        Mood and Affect: Mood normal.        Behavior: Behavior normal.      ------------------------------------------------------------------------------------------------------------------------------------------------------------------------------------------------------------------- Assessment and Plan  Essential hypertension BP elevated today.  Return in 2 weeks for BP check.  Continue losartan  at current strength.   Hyperlipidemia He has done well with atorvastatin  at current strength.  Recommend continuation.    No orders of the defined types were placed in this encounter.   Return in about 2 weeks (around 01/12/2024) for nurse visit for BP check.        [1] No Known Allergies  "

## 2024-01-11 NOTE — Progress Notes (Signed)
" ° °  Subjective:    Patient ID: Trevor Guerrero, male    DOB: 1960/10/09, 64 y.o.   MRN: [id number]  HPI  Patient is here for a 2 week BP recheck. Last OV it read 150/76. Patient currently takes losartan  40 mg. Denies CP, SOB, headache, medication issues, or vision changes.  Review of Systems     Objective:   Physical Exam        Assessment & Plan:   Patients first BP is 155/74 second is 128/70 reported to PCP Dr. Alvia who would like to make no changes. Pt advise to RTC for routine f/u with Dr. Alvia "

## 2024-01-12 ENCOUNTER — Ambulatory Visit

## 2024-01-12 VITALS — BP 128/70 | HR 63 | Resp 19 | Ht 72.0 in | Wt 253.0 lb

## 2024-01-12 DIAGNOSIS — I10 Essential (primary) hypertension: Secondary | ICD-10-CM

## 2024-06-28 ENCOUNTER — Ambulatory Visit: Admitting: Family Medicine
